# Patient Record
Sex: Female | Born: 1960 | Race: Black or African American | Hispanic: No | Marital: Single | Smoking: Never smoker
Health system: Southern US, Community
[De-identification: ages and names within clinical notes are randomized; demographics above are authoritative.]

## PROBLEM LIST (undated history)

## (undated) HISTORY — PX: OTHER SURGICAL HISTORY: SHX169

## (undated) HISTORY — PX: FOOT OSTEOTOMY W/ PLANTAR FASCIA RELEASE: SHX1665

## (undated) HISTORY — PX: ABDOMINAL HYSTERECTOMY: SHX81

---

## 1999-04-05 ENCOUNTER — Encounter: Payer: Self-pay | Admitting: Emergency Medicine

## 1999-04-05 ENCOUNTER — Emergency Department (HOSPITAL_COMMUNITY): Admission: EM | Admit: 1999-04-05 | Discharge: 1999-04-05 | Payer: Self-pay | Admitting: Emergency Medicine

## 2001-03-22 ENCOUNTER — Encounter: Payer: Self-pay | Admitting: Internal Medicine

## 2001-03-22 ENCOUNTER — Emergency Department (HOSPITAL_COMMUNITY): Admission: EM | Admit: 2001-03-22 | Discharge: 2001-03-22 | Payer: Self-pay | Admitting: Internal Medicine

## 2005-04-07 ENCOUNTER — Encounter: Admission: RE | Admit: 2005-04-07 | Discharge: 2005-04-07 | Payer: Self-pay | Admitting: Orthopedic Surgery

## 2005-10-22 ENCOUNTER — Ambulatory Visit (HOSPITAL_COMMUNITY): Admission: RE | Admit: 2005-10-22 | Discharge: 2005-10-22 | Payer: Self-pay | Admitting: Orthopedic Surgery

## 2006-03-08 ENCOUNTER — Ambulatory Visit (HOSPITAL_BASED_OUTPATIENT_CLINIC_OR_DEPARTMENT_OTHER): Admission: RE | Admit: 2006-03-08 | Discharge: 2006-03-08 | Payer: Self-pay | Admitting: Orthopedic Surgery

## 2008-12-31 ENCOUNTER — Encounter: Admission: RE | Admit: 2008-12-31 | Discharge: 2008-12-31 | Payer: Self-pay | Admitting: Family Medicine

## 2010-09-21 ENCOUNTER — Encounter: Payer: Self-pay | Admitting: Family Medicine

## 2011-01-15 NOTE — Op Note (Signed)
Lisa Hanson, Lisa Hanson               ACCOUNT NO.:  [id number]   MEDICAL RECORD NO.:  [id number]          PATIENT TYPE:  AMB   LOCATION:  DSC                          FACILITY:  [facility]   PHYSICIAN:  Leonides Grills, M.D.     DATE OF BIRTH:  05/06/1961   DATE OF PROCEDURE:  03/08/2006  DATE OF DISCHARGE:                                 OPERATIVE REPORT   PREOPERATIVE DIAGNOSES:  1.  Left anterolateral ankle impingement.  2.  Subluxing left peroneal tendons.  3.  Left peroneal tenosynovitis.   POSTOPERATIVE DIAGNOSES:  1.  Left anterolateral ankle impingement.  2.  Subluxing left peroneal tendons.  3.  Left peroneal tenosynovitis.   OPERATION:  1.  Left ankle arthroscopy with extensive debridement.  2.  Left repair of subluxing peroneal tendons without fibular osteotomy.  3.  Left tenosynovectomy, peroneal tendons.   ANESTHESIA:  General.   SURGEON:  Leonides Grills, M.D.; Lianne Cure, P.A.   ESTIMATED BLOOD LOSS:  Minimal.   TOURNIQUET TIME:  Approximately an hour.   COMPLICATIONS:  None.   DISPOSITION:  Stable to PR.   INDICATIONS:  This is a 50 year old female who has had longstanding lateral  left ankle pain that was interfering with her life when she had things she  wanted to do.  She was consented for the above procedure and all risks,  which included infection, neurovascular injury, persistent pain, worse pain,  stiffness, arthritis, weakness, instability, were all explained.  Questions  were encouraged and answered.   OPERATIVE PROCEDURE:  The patient was brought to the operating room, placed  in supine position initially.  After adequate general endotracheal tube  anesthesia was administered as well as Ancef 1 g IV piggyback, the patient  was then placed in a sloppy lateral position, operative site up on a  beanbag.  All bony prominences were well-padded.  Left lower extremity was  then prepped and draped in a sterile manner with proximally placed thigh  tourniquet.  We marked out the anatomical landmarks to include anterior  tibialis tendon, peroneus teres tertius.  Superficial peroneal nerve could  not be visualized.  Spinal needle was then placed just medial to the  anterior tibialis tendon, 20 mL of normal saline was instilled in the ankle.  Lisa Hanson and spread technique was then utilized to create the anteromedial  portal, medial to the anterior tibialis tendon.  Blunt-tipped trocar with  cannula followed by camera was placed into the ankle and under direct  visualization, the anterolateral portal was then created lateral to the  peroneus teres tertius tendon, illuminating the skin, to prevent any injury  to the superficial peroneal nerve.  This was done with a spinal needle  followed by nick and spread technique.  There was an accessory tib-fib  ligament with synovitis around the ligament itself.  There was subtle  impingement of the anterolateral aspect of the talus but there was no  osteochondral lesion.  We then performed an extensive debridement of the  ankle synovitis and excision of the accessory tib-fib ligament using a bevel  radiofrequency debrider.  Once this was  done, the ankle was ranged, there  was no impingement, there was adequate widening of the distal syndesmosis as  well, and the entire rest of the ankle was without pathology.  Pictures were  obtained throughout the procedure.  Camera was removed.   Then, the limb was gradually exsanguinated, tourniquet was elevated to 200  mmHg.  A curvilinear incision was made on the posterolateral aspect of the  ankle.  Dissection was carried down through the skin.  Hemostasis was  obtained.  Retinaculum was incised carefully, about 2 mm from the  posterolateral surface of the lateral malleolus.  Once this was done, care  was taken not to injure the peroneal tendons.  There was a tremendous amount  of synovitis within the peroneal tendons.  In the process of performing a   tenosynovectomy, it was found that the peroneus brevis tendon muscle belly  was distalized even beyond the tip of the lateral malleolus, or just to the  tip of the lateral malleolus.  We then debrided the muscle belly back  approximately 3 cm, and found that this was adequate for decompression of  this area.  There was no tear within the peroneal tendons.  There was  adequate concavity of the posterior aspect of the lateral malleolus.  A  trough was then created on the posterolateral aspect of the lateral  malleolus with a curved 0.25-inch osteotome and a rongeur.  Once this was  created, 2 mm drill holes were placed, and a micro-bioabsorbable suture  anchor was placed with #2 FiberWire.  The area was copiously irrigated with  normal saline between the tendons in the area.  We then placed a #2  FiberWire, not only with the anchors, but also with free FiberWire,  visualizing the tendons to make sure that they were not violated.  A  retinaculum was then advanced and repaired back to the exposed trough.  Once  this was done, the ankle was ranged and there was excellent excursion of the  tendons, and the repair was excellent as well.  The area was copiously  irrigated with normal saline.  Once again, tourniquet was deflated,  hemostasis was obtained, subcutaneous was closed with 3-0 Vicryl, skin was  closed with 4-0 nylon, overall wounds.  Sterile dressing was applied,  modified Jones dressing was applied.  The patient was stable to the PAR.      Leonides Grills, M.D.  Electronically Signed     PB/MEDQ  D:  03/08/2006  T:  03/09/2006  Job:  [id number]

## 2011-03-10 ENCOUNTER — Encounter: Payer: 59 | Attending: Endocrinology

## 2011-03-10 DIAGNOSIS — Z713 Dietary counseling and surveillance: Secondary | ICD-10-CM | POA: Insufficient documentation

## 2011-03-10 DIAGNOSIS — E119 Type 2 diabetes mellitus without complications: Secondary | ICD-10-CM | POA: Insufficient documentation

## 2011-03-10 NOTE — Progress Notes (Signed)
  Patient was seen on 03/10/11  for the first of a series of three diabetes self-management courses at the [facility]. The following learning objectives were met by the patient during this course:   Defines diabetes and the role of insulin  Identifies type of diabetes and pathophysiology  States normal BG range and personal goals  Identifies three risk factors for the development of diabetes  States the need for and frequency of healthcare follow up (ADA Standards of Care)   Patient has established the following initial goals:  Increase exercise levels  Work on managing stress levels  Follow a diabetes meal plan  Lose weight  Monitor blood glucose levels more frequently  Follow-Up Plan: Patient will attend Core Diabetes Classes at [facility] in August 2012

## 2011-03-10 NOTE — Patient Instructions (Signed)
Patient will attend Core Diabetes Classes at [facility] in August 2012

## 2011-04-15 ENCOUNTER — Ambulatory Visit: Payer: 59

## 2011-04-22 ENCOUNTER — Ambulatory Visit: Payer: 59

## 2011-04-26 ENCOUNTER — Encounter: Payer: Self-pay | Admitting: Dietician

## 2011-09-21 ENCOUNTER — Ambulatory Visit (INDEPENDENT_AMBULATORY_CARE_PROVIDER_SITE_OTHER): Payer: 59

## 2011-09-21 DIAGNOSIS — E860 Dehydration: Secondary | ICD-10-CM

## 2011-09-21 DIAGNOSIS — R112 Nausea with vomiting, unspecified: Secondary | ICD-10-CM

## 2012-06-21 ENCOUNTER — Ambulatory Visit (INDEPENDENT_AMBULATORY_CARE_PROVIDER_SITE_OTHER): Payer: 59 | Admitting: Family Medicine

## 2012-06-21 ENCOUNTER — Ambulatory Visit: Payer: 59

## 2012-06-21 VITALS — BP 120/78 | HR 78 | Temp 98.1°F | Resp 16 | Ht 67.0 in | Wt 202.2 lb

## 2012-06-21 DIAGNOSIS — M79671 Pain in right foot: Secondary | ICD-10-CM

## 2012-06-21 DIAGNOSIS — M25571 Pain in right ankle and joints of right foot: Secondary | ICD-10-CM

## 2012-06-21 DIAGNOSIS — M79609 Pain in unspecified limb: Secondary | ICD-10-CM

## 2012-06-21 DIAGNOSIS — M25579 Pain in unspecified ankle and joints of unspecified foot: Secondary | ICD-10-CM

## 2012-06-21 DIAGNOSIS — E119 Type 2 diabetes mellitus without complications: Secondary | ICD-10-CM

## 2012-06-21 NOTE — Progress Notes (Deleted)
  Subjective:    Patient ID: Lisa Hanson, female    DOB: 04-Jan-1961, 51 y.o.   MRN: [id number]  HPI    Review of Systems     Objective:   Physical Exam        Assessment & Plan:

## 2012-06-21 NOTE — Progress Notes (Signed)
New Patient Visit:  HPI:  Ankle and foot pain x 2 days.  Pt was cutting grass and rolled ankle in hole.  Has had ankle pain and swelling since this point.  No numbness or paresthesias.  Baseline diabetic.  Last A1C was 9 approx 4 months ago.  No prior hx/o peripheral neuropathy.   Patient Active Problem List  Diagnosis  . Diabetes mellitus   Past Medical History: Past Medical History  Diagnosis Date  . Diabetes mellitus     Past Surgical History: Past Surgical History  Procedure Date  . Abdominal hysterectomy     Social History: History   Social History  . Marital Status: Single    Spouse Name: N/A    Number of Children: N/A  . Years of Education: N/A   Social History Main Topics  . Smoking status: Never Smoker   . Smokeless tobacco: Not on file  . Alcohol Use: No  . Drug Use:   . Sexually Active:    Other Topics Concern  . Not on file   Social History Narrative  . No narrative on file    Family History: Family History  Problem Relation Age of Onset  . Diabetes Mother   . Diabetes Father     Allergies: No Known Allergies  Current Outpatient Prescriptions  Medication Sig Dispense Refill  . insulin glargine (LANTUS) 100 UNIT/ML injection 60 Units 2 (two) times daily.      Marland Kitchen lisinopril (PRINIVIL,ZESTRIL) 10 MG tablet Take 10 mg by mouth daily.      . metFORMIN (GLUCOPHAGE) 500 MG tablet Take 500 mg by mouth 2 (two) times daily with a meal.      . rosuvastatin (CRESTOR) 10 MG tablet Take 10 mg by mouth daily.       Review Of Systems: 12 point ROS negative except as noted above in HPI.   Physical Exam: Filed Vitals:   06/21/12 0915  BP: 120/78  Pulse: 78  Temp: 98.1 F (36.7 C)  Resp: 16   General: alert and cooperative HEENT: PERRLA and extra ocular movement intact Heart: S1, S2 normal, no murmur, rub or gallop, regular rate and rhythm Lungs: unlabored breathing Abdomen: abdomen is soft without significant tenderness, masses, organomegaly  or guarding Extremities: + R lateral ankle TTP, + pain with ankle eversion and inversion. Mild lateral ankle swelling. No distal numbness.  Skin:no rashes Neurology: normal without focal findings  Labs and Imaging:  [facility] reading (PRIMARY) by  Dr. Alvester Morin. R ankle and R foot xrays preliminarily negative for fracture or dislocation.    Assessment and Plan: R ankle/foot sprain.  Will place in ankle brace.  RICE and NSAIDs.  Discussed general care and red flags for reevaluation.  Will plan for follow up with sports medicine in setting of poorly controlled DM given risk for secondary complications.     The patient and/or caregiver has been counseled thoroughly with regard to treatment plan and/or medications prescribed including dosage, schedule, interactions, rationale for use, and possible side effects and they verbalize understanding. Diagnoses and expected course of recovery discussed and will return if not improved as expected or if the condition worsens. Patient and/or caregiver verbalized understanding.

## 2012-06-28 ENCOUNTER — Ambulatory Visit (INDEPENDENT_AMBULATORY_CARE_PROVIDER_SITE_OTHER): Payer: 59 | Admitting: Family Medicine

## 2012-06-28 VITALS — BP 126/78 | Ht 67.0 in | Wt 203.0 lb

## 2012-06-28 DIAGNOSIS — S86319A Strain of muscle(s) and tendon(s) of peroneal muscle group at lower leg level, unspecified leg, initial encounter: Secondary | ICD-10-CM

## 2012-06-28 DIAGNOSIS — S86819A Strain of other muscle(s) and tendon(s) at lower leg level, unspecified leg, initial encounter: Secondary | ICD-10-CM

## 2012-06-28 DIAGNOSIS — S838X9A Sprain of other specified parts of unspecified knee, initial encounter: Secondary | ICD-10-CM

## 2012-06-28 NOTE — Assessment & Plan Note (Addendum)
Will provide a sports aircast for support.  Continue icing and alternating ibuprofen and tylenol for pain.  ROM exercises given (alphabet with big toe).  Will return to clinic in 2-3 weeks if no improvement.  Will likely need cam walker if not improving. Topical NSAIDs and Oral NSAIDs prn

## 2012-06-28 NOTE — Progress Notes (Signed)
  Subjective:    Patient ID: ARIE GABLE, female    DOB: 1961-08-18, 51 y.o.   MRN: [id number]  HPI Ms. Kildow is here today for evaluation of right foot pain.  She reports rolling her ankle while doing yard work 10 days ago.  There was some mild swelling the first two days.  Pain is located more in the lateral foot than ankle.  She was able to immediately bear weight, but with pain.  Two days after the injury she was evaluated at urgent care where then took x-rays which were negative for fractures.  She is wearing an ankle brace, but continues to have pain, particularly after being on her feet at work.  Icing daily.  Taking ibuprofen and tylenol for pain.  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

## 2012-06-28 NOTE — Patient Instructions (Addendum)
You strained the peroneal tendons. Keep icing it everyday with the frozen water bottle. Continue to use tylenol or ibuprofen as needed. Wear the brace every day.  Spell the alphabet with your foot at least once a day. Follow up in 2-3 weeks in your foot is not better.

## 2012-10-03 ENCOUNTER — Ambulatory Visit (INDEPENDENT_AMBULATORY_CARE_PROVIDER_SITE_OTHER): Payer: 59 | Admitting: Family Medicine

## 2012-10-03 VITALS — BP 114/73 | HR 99 | Temp 98.5°F | Resp 18 | Ht 65.78 in | Wt 196.0 lb

## 2012-10-03 DIAGNOSIS — J329 Chronic sinusitis, unspecified: Secondary | ICD-10-CM

## 2012-10-03 MED ORDER — IPRATROPIUM BROMIDE 0.03 % NA SOLN: 2.0000 | Freq: Two times a day (BID) | NASAL | Status: DC

## 2012-10-03 MED ORDER — FLUTICASONE PROPIONATE 50 MCG/ACT NA SUSP: 2.0000 | Freq: Every day | NASAL | Status: DC

## 2012-10-03 MED ORDER — HYDROCODONE-HOMATROPINE 5-1.5 MG/5ML PO SYRP: 5.0000 mL | ORAL_SOLUTION | Freq: Three times a day (TID) | ORAL | Status: DC | PRN

## 2012-10-03 MED ORDER — AMOXICILLIN 875 MG PO TABS: 875.0000 mg | ORAL_TABLET | Freq: Three times a day (TID) | ORAL | Status: DC

## 2012-10-03 NOTE — Patient Instructions (Addendum)
Hot showers or breathing in steam may help loosen the congestion.  Using a netti pot or sinus rinse is also likely to help you feel better and keep this from progressing.  Use the atrovent nasal spray as needed throughout the day and use the fluticasone nasal spray every night before bed for at least 2 weeks.  I recommend augmenting with generic mucinex to help you move out the congestion.  If no improvement or you are getting worse, come back as you might need a course of steroids but hopefully with all of the above, you can avoid it.  If you get worse with more fevers and chills, then start the amoxicillin  Use some ear wax softening drops for several days (like debrox) then come back to clinic to get your ears flushed out.     Sinusitis Sinusitis is redness, soreness, and swelling (inflammation) of the paranasal sinuses. Paranasal sinuses are air pockets within the bones of your face (beneath the eyes, the middle of the forehead, or above the eyes). In healthy paranasal sinuses, mucus is able to drain out, and air is able to circulate through them by way of your nose. However, when your paranasal sinuses are inflamed, mucus and air can become trapped. This can allow bacteria and other germs to grow and cause infection. Sinusitis can develop quickly and last only a short time (acute) or continue over a long period (chronic). Sinusitis that lasts for more than 12 weeks is considered chronic.  CAUSES  Causes of sinusitis include:  Allergies.  Structural abnormalities, such as displacement of the cartilage that separates your nostrils (deviated septum), which can decrease the air flow through your nose and sinuses and affect sinus drainage.  Functional abnormalities, such as when the small hairs (cilia) that line your sinuses and help remove mucus do not work properly or are not present. SYMPTOMS  Symptoms of acute and chronic sinusitis are the same. The primary symptoms are pain and pressure around  the affected sinuses. Other symptoms include:  Upper toothache.  Earache.  Headache.  Bad breath.  Decreased sense of smell and taste.  A cough, which worsens when you are lying flat.  Fatigue.  Fever.  Thick drainage from your nose, which often is green and may contain pus (purulent).  Swelling and warmth over the affected sinuses. DIAGNOSIS  Your caregiver will perform a physical exam. During the exam, your caregiver may:  Look in your nose for signs of abnormal growths in your nostrils (nasal polyps).  Tap over the affected sinus to check for signs of infection.  View the inside of your sinuses (endoscopy) with a special imaging device with a light attached (endoscope), which is inserted into your sinuses. If your caregiver suspects that you have chronic sinusitis, one or more of the following tests may be recommended:  Allergy tests.  Nasal culture A sample of mucus is taken from your nose and sent to a lab and screened for bacteria.  Nasal cytology A sample of mucus is taken from your nose and examined by your caregiver to determine if your sinusitis is related to an allergy. TREATMENT  Most cases of acute sinusitis are related to a viral infection and will resolve on their own within 10 days. Sometimes medicines are prescribed to help relieve symptoms (pain medicine, decongestants, nasal steroid sprays, or saline sprays).  However, for sinusitis related to a bacterial infection, your caregiver will prescribe antibiotic medicines. These are medicines that will help kill the bacteria causing  the infection.  Rarely, sinusitis is caused by a fungal infection. In theses cases, your caregiver will prescribe antifungal medicine. For some cases of chronic sinusitis, surgery is needed. Generally, these are cases in which sinusitis recurs more than 3 times per year, despite other treatments. HOME CARE INSTRUCTIONS   Drink plenty of water. Water helps thin the mucus so your  sinuses can drain more easily.  Use a humidifier.  Inhale steam 3 to 4 times a day (for example, sit in the bathroom with the shower running).  Apply a warm, moist washcloth to your face 3 to 4 times a day, or as directed by your caregiver.  Use saline nasal sprays to help moisten and clean your sinuses.  Take over-the-counter or prescription medicines for pain, discomfort, or fever only as directed by your caregiver. SEEK IMMEDIATE MEDICAL CARE IF:  You have increasing pain or severe headaches.  You have nausea, vomiting, or drowsiness.  You have swelling around your face.  You have vision problems.  You have a stiff neck.  You have difficulty breathing. MAKE SURE YOU:   Understand these instructions.  Will watch your condition.  Will get help right away if you are not doing well or get worse. Document Released: 08/16/2005 Document Revised: 11/08/2011 Document Reviewed: 08/31/2011 [facility] Patient Information 2013 [place], [place].

## 2012-10-03 NOTE — Progress Notes (Signed)
Subjective:    Patient ID: Lisa Hanson, female    DOB: 10-20-1960, 52 y.o.   MRN: [id number]  HPI  At first she thought she had a sinus infection which started last Fri - 4d prev - with sneezing a lot then woke up with right face hurting the following day, no energy, then went to work Sun night at 3rd shift and began feeling horrible.  Yesterday she began coughing spells productive of phlegm - coughing so much she regurgitates.  She is having chills but no fevers.  No myalgias/arthralgias though is having some right flank pain that began yesterday.  Tol po well, and urinating normally, nml BM.  Sleeping a lot.  Using sinus pressure otc med w/ minimal relief.  No known sick contacts.  Did not get flu shot this year.  She is watching her cbgs and they have increased to high 200s - she is still taking her insulin 70 u qam, 25 with lunch, 40 with dinner.    Past Medical History  Diagnosis Date  . Diabetes mellitus     Review of Systems  Constitutional: Positive for chills, activity change and fatigue. Negative for fever, diaphoresis and appetite change.  HENT: Positive for ear pain, congestion, sore throat, facial swelling, rhinorrhea, sneezing and sinus pressure. Negative for nosebleeds, trouble swallowing, neck pain, neck stiffness, voice change, postnasal drip and ear discharge.   Eyes: Negative for discharge and itching.  Respiratory: Positive for cough. Negative for shortness of breath.   Cardiovascular: Positive for chest pain.  Gastrointestinal: Positive for vomiting. Negative for nausea, abdominal pain, diarrhea and constipation.  Genitourinary: Positive for flank pain. Negative for dysuria, urgency, frequency, hematuria, decreased urine volume and difficulty urinating.  Musculoskeletal: Negative for myalgias, arthralgias and gait problem.  Skin: Negative for rash.  Neurological: Positive for headaches. Negative for dizziness and syncope.  Hematological: Positive for adenopathy.   Psychiatric/Behavioral: Negative for sleep disturbance.      BP 114/73  Pulse 99  Temp 98.5 F (36.9 C) (Oral)  Resp 18  Ht 5' 5.78" (1.671 m)  Wt 196 lb (88.905 kg)  BMI 31.85 kg/m2  SpO2 98% Objective:   Physical Exam  Constitutional: She is oriented to person, place, and time. She appears well-developed and well-nourished. She appears lethargic. She appears ill. No distress.  HENT:  Head: Normocephalic and atraumatic.  Right Ear: External ear normal.  Left Ear: External ear normal.  Nose: Mucosal edema and rhinorrhea present. Right sinus exhibits maxillary sinus tenderness. Left sinus exhibits maxillary sinus tenderness.  Mouth/Throat: Uvula is midline and mucous membranes are normal. Posterior oropharyngeal erythema present. No oropharyngeal exudate, posterior oropharyngeal edema or tonsillar abscesses.       Bilateral canals impacted w/ dark black cerumen with what looks like brown cotton qtip threads embedded  Eyes: Conjunctivae normal are normal. Right eye exhibits no discharge. Left eye exhibits no discharge. No scleral icterus.  Neck: Normal range of motion. Neck supple.  Cardiovascular: Normal rate, regular rhythm, normal heart sounds and intact distal pulses.   Pulmonary/Chest: Effort normal and breath sounds normal.  Lymphadenopathy:       Head (right side): Submandibular adenopathy present. No preauricular and no posterior auricular adenopathy present.       Head (left side): Submandibular adenopathy present. No preauricular and no posterior auricular adenopathy present.    She has no cervical adenopathy.       Right: No supraclavicular adenopathy present.       Left: No supraclavicular adenopathy  present.  Neurological: She is oriented to person, place, and time. She appears lethargic.  Skin: Skin is warm and dry. She is not diaphoretic. No erythema.  Psychiatric: She has a normal mood and affect. Her behavior is normal.          Assessment & Plan:   1.  Sinusitis  amoxicillin (AMOXIL) 875 MG tablet, ipratropium (ATROVENT) 0.03 % nasal spray, fluticasone (FLONASE) 50 MCG/ACT nasal spray, HYDROcodone-homatropine (HYCODAN) 5-1.5 MG/5ML syrup  2. DM - pt knows to keep taking her insulin even if she is eating less and to call her PCP or RTC here if cbgs cont to increase 3. Cerumen impaction - rec to use debrox ear drops for sev days then RTC for ear lavage Meds ordered this encounter  Medications         . amoxicillin (AMOXIL) 875 MG tablet    Sig: Take 1 tablet (875 mg total) by mouth 3 (three) times daily.    Dispense:  30 tablet    Refill:  0  . ipratropium (ATROVENT) 0.03 % nasal spray    Sig: Place 2 sprays into the nose 2 (two) times daily.    Dispense:  30 mL    Refill:  1  . fluticasone (FLONASE) 50 MCG/ACT nasal spray    Sig: Place 2 sprays into the nose daily.    Dispense:  16 g    Refill:  2  . HYDROcodone-homatropine (HYCODAN) 5-1.5 MG/5ML syrup    Sig: Take 5 mLs by mouth every 8 (eight) hours as needed for cough.    Dispense:  150 mL    Refill:  0

## 2013-04-15 ENCOUNTER — Ambulatory Visit (INDEPENDENT_AMBULATORY_CARE_PROVIDER_SITE_OTHER): Payer: 59 | Admitting: Emergency Medicine

## 2013-04-15 VITALS — BP 122/68 | HR 88 | Temp 98.8°F | Resp 16 | Ht 67.5 in | Wt 201.0 lb

## 2013-04-15 DIAGNOSIS — M5412 Radiculopathy, cervical region: Secondary | ICD-10-CM

## 2013-04-15 DIAGNOSIS — G5621 Lesion of ulnar nerve, right upper limb: Secondary | ICD-10-CM

## 2013-04-15 DIAGNOSIS — M7710 Lateral epicondylitis, unspecified elbow: Secondary | ICD-10-CM

## 2013-04-15 DIAGNOSIS — M7711 Lateral epicondylitis, right elbow: Secondary | ICD-10-CM

## 2013-04-15 MED ORDER — TENNIS ELBOW STRAP MISC: 1.0000 | Freq: Every day | Status: DC

## 2013-04-15 MED ORDER — NAPROXEN SODIUM 550 MG PO TABS: 550.0000 mg | ORAL_TABLET | Freq: Two times a day (BID) | ORAL | Status: AC

## 2013-04-15 NOTE — Progress Notes (Signed)
[facility] [address] [place] [address] [phone]  Date:  04/15/2013   Name:  Lisa Hanson   DOB:  Aug 30, 1961   MRN:  [id number]  PCP:  No primary provider on file.    Chief Complaint: Elbow Pain   History of Present Illness:  Lisa Hanson is a 52 y.o. very pleasant female patient who presents with the following:  History of pain in right elbow and numbness in her ulnar distribution especially her fifth right finger.  No history of injury.  Has had pain for months.  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 She uses her right arm extensively at work.   Has been off season since April.  No improvement with over the counter medications or other home remedies. Denies other complaint or health concern today.   Patient Active Problem List   Diagnosis Date Noted  . Strain of peroneal tendon 06/28/2012  . Diabetes mellitus 03/10/2011    Past Medical History  Diagnosis Date  . Diabetes mellitus     Past Surgical History  Procedure Laterality Date  . Abdominal hysterectomy    . Ankle      reconstructive surgery    History  Substance Use Topics  . Smoking status: Never Smoker   . Smokeless tobacco: Not on file  . Alcohol Use: No    Family History  Problem Relation Age of Onset  . Diabetes Mother   . Heart failure Mother   . Diabetes Father     No Known Allergies  Medication list has been reviewed and updated.  Current Outpatient Prescriptions on File Prior to Visit  Medication Sig Dispense Refill  . Insulin Aspart Prot & Aspart (NOVOLOG MIX 70/30 Hatch) Inject into the skin. Pt dosage 70-25-40      . lisinopril (PRINIVIL,ZESTRIL) 10 MG tablet Take 10 mg by mouth daily.      . metFORMIN (GLUCOPHAGE) 500 MG tablet Take 500 mg by mouth 2 (two) times daily with a meal.      . rosuvastatin (CRESTOR) 10 MG tablet Take 10 mg by mouth daily.      Marland Kitchen amoxicillin (AMOXIL) 875 MG tablet Take 1 tablet (875 mg total) by mouth 3  (three) times daily.  30 tablet  0  . fluticasone (FLONASE) 50 MCG/ACT nasal spray Place 2 sprays into the nose daily.  16 g  2  . HYDROcodone-homatropine (HYCODAN) 5-1.5 MG/5ML syrup Take 5 mLs by mouth every 8 (eight) hours as needed for cough.  150 mL  0  . ipratropium (ATROVENT) 0.03 % nasal spray Place 2 sprays into the nose 2 (two) times daily.  30 mL  1   No current facility-administered medications on file prior to visit.    Review of Systems:  As per HPI, otherwise negative.    Physical Examination: Filed Vitals:   04/15/13 1252  BP: 122/68  Pulse: 88  Temp: 98.8 F (37.1 C)  Resp: 16   Filed Vitals:   04/15/13 1252  Height: 5' 7.5" (1.715 m)  Weight: 201 lb (91.173 kg)   Body mass index is 31 kg/(m^2). Ideal Body Weight: Weight in (lb) to have BMI = 25: 161.7   GEN: WDWN, NAD, Non-toxic, Alert & Oriented x 3 HEENT: Atraumatic, Normocephalic.  Ears and Nose: No external deformity. EXTR: No clubbing/cyanosis/edema NEURO: Normal gait.  PSYCH: Normally interactive. Conversant. Not depressed or anxious appearing.  Calm demeanor.  RIGHT elbow:  Tender lateral condyle.  Neuro grossly intact with normal motor and sensory.  Assessment and Plan: Tennis elbow Ulnar neuritis Anaprox Tennis elbow strap EMG/NCS Follow up as needed   Signed,  Phillips Odor, MD

## 2013-04-15 NOTE — Patient Instructions (Addendum)
Tennis Elbow  Your caregiver has diagnosed you with a condition often referred to as "tennis elbow." This results from small tears or soreness (inflammation) at the start (origin) of the extensor muscles of the forearm. Although the condition is often called tennis or golfer's elbow, it is caused by any repetitive action performed by your elbow.  HOME CARE INSTRUCTIONS   If the condition has been short lived, rest may be the only treatment required. Using your opposite hand or arm to perform the task may help. Even changing your grip may help rest the extremity. These may even prevent the condition from recurring.   Longer standing problems, however, will often be relieved faster by:   Using anti-inflammatory agents.   Applying ice packs for 30 minutes at the end of the working day, at bed time, or when activities are finished.   Your caregiver may also have you wear a splint or sling. This will allow the inflamed tendon to heal.  At times, steroid injections aided with a local anesthetic will be required along with splinting for 1 to 2 weeks. Two to three steroid injections will often solve the problem. In some long standing cases, the inflamed tendon does not respond to conservative (non-surgical) therapy. Then surgery may be required to repair it.  MAKE SURE YOU:    Understand these instructions.   Will watch your condition.   Will get help right away if you are not doing well or get worse.  Document Released: 08/16/2005 Document Revised: 11/08/2011 Document Reviewed: 04/03/2008  [employer] Patient Information 2014 [employer].

## 2013-04-27 ENCOUNTER — Ambulatory Visit (INDEPENDENT_AMBULATORY_CARE_PROVIDER_SITE_OTHER): Payer: 59 | Admitting: Neurology

## 2013-04-27 ENCOUNTER — Ambulatory Visit (INDEPENDENT_AMBULATORY_CARE_PROVIDER_SITE_OTHER): Payer: 59

## 2013-04-27 DIAGNOSIS — M79609 Pain in unspecified limb: Secondary | ICD-10-CM

## 2013-04-27 DIAGNOSIS — R209 Unspecified disturbances of skin sensation: Secondary | ICD-10-CM

## 2013-04-27 DIAGNOSIS — G542 Cervical root disorders, not elsewhere classified: Secondary | ICD-10-CM

## 2013-04-27 NOTE — Procedures (Signed)
  HISTORY:  Lisa Hanson is a 52 year old patient with a several month history of right arm discomfort and some tingling sensations into the fifth finger of the right hand. The patient does have some right shoulder discomfort as well. The patient is being evaluated for a possible neuropathy or a cervical radiculopathy.  NERVE CONDUCTION STUDIES:  Nerve conduction studies were performed on both upper extremities. The distal motor latencies and motor amplitudes for the median and ulnar nerves were within normal limits. The F wave latencies and nerve conduction velocities for these nerves were also normal. The sensory latencies for the median and ulnar nerves were normal.   EMG STUDIES:  EMG study was performed on the right upper extremity:  The first dorsal interosseous muscle reveals 2 to 4 K units with full recruitment. No fibrillations or positive waves were noted. The abductor pollicis brevis muscle reveals 2 to 5 K units with decreased recruitment. No fibrillations or positive waves were noted. The extensor indicis proprius muscle reveals 1 to 3 K units with full recruitment. No fibrillations or positive waves were noted. The pronator teres muscle reveals 2 to 3 K units with full recruitment. No fibrillations or positive waves were noted. The biceps muscle reveals 1 to 2 K units with full recruitment. No fibrillations or positive waves were noted. The triceps muscle reveals 2 to 4 K units with full recruitment. One plus fibrillations and positive waves were noted. The anterior deltoid muscle reveals 2 to 3 K units with full recruitment. No fibrillations or positive waves were noted. Insertional activity was slightly increased. The cervical paraspinal muscles were tested at 2 levels. 2+ fibrillations and positive waves were seen at both levels tested. There was good relaxation.   IMPRESSION:  Nerve conduction studies done on both upper extremities were within normal limits. No evidence of  a neuropathy is seen. EMG evaluation of the right upper extremity shows abnormalities that are most consistent with an acute right C7 radiculopathy, possibly with some C6 involvement as well.  Marlan Palau MD 04/27/2013 4:07 PM  [facility] [address] [place], [place] [address]  Phone [phone] Fax [phone]

## 2013-05-01 NOTE — Addendum Note (Signed)
Addended by: Carmelina Dane on: 05/01/2013 08:58 AM   Modules accepted: Orders

## 2013-05-08 ENCOUNTER — Ambulatory Visit (INDEPENDENT_AMBULATORY_CARE_PROVIDER_SITE_OTHER): Payer: 59 | Admitting: Physician Assistant

## 2013-05-08 ENCOUNTER — Ambulatory Visit
Admission: RE | Admit: 2013-05-08 | Discharge: 2013-05-08 | Disposition: A | Discharge status: Home / Self Care | Payer: 59 | Source: Ambulatory Visit | Attending: Emergency Medicine | Admitting: Emergency Medicine

## 2013-05-08 VITALS — BP 110/64 | HR 81 | Temp 98.2°F | Resp 18 | Ht 67.0 in | Wt 204.0 lb

## 2013-05-08 DIAGNOSIS — H6120 Impacted cerumen, unspecified ear: Secondary | ICD-10-CM

## 2013-05-08 DIAGNOSIS — H6123 Impacted cerumen, bilateral: Secondary | ICD-10-CM

## 2013-05-08 DIAGNOSIS — G5621 Lesion of ulnar nerve, right upper limb: Secondary | ICD-10-CM

## 2013-05-08 NOTE — Progress Notes (Signed)
  Subjective:    Patient ID: Lisa Hanson, female    DOB: 08/08/1961, 52 y.o.   MRN: [id number]  HPI   Lisa Hanson is a very pleasant 52 yr old female here with concern for cerumen impaction.  Was having a hearing test done yesterday at work and nurse told her there was lots of wax in the right ear.  Nurse suggested an otc ear drop which pt used, now states can't hear out of right ear.  Feels like she's in a tunnel.  No pain or pressure.  No drainage.  Left ear feels ok.  No associated symptoms.  Pt does wear ear plugs at work, wonders if this could have caused wax to become impacted.     Review of Systems  Constitutional: Negative.   HENT: Positive for hearing loss (right). Negative for ear pain, congestion, rhinorrhea and tinnitus.   Respiratory: Negative.   Cardiovascular: Negative.   Gastrointestinal: Negative.   Musculoskeletal: Negative.   Skin: Negative.   Neurological: Negative.        Objective:   Physical Exam  Vitals reviewed. Constitutional: She is oriented to person, place, and time. She appears well-developed and well-nourished. No distress.  HENT:  Head: Normocephalic and atraumatic.  Bilateral canals completely occluded with cerumen  Eyes: Conjunctivae are normal. No scleral icterus.  Pulmonary/Chest: Effort normal.  Lymphadenopathy:    She has no cervical adenopathy.  Neurological: She is alert and oriented to person, place, and time.  Skin: Skin is warm and dry.  Psychiatric: She has a normal mood and affect. Her behavior is normal.    Bilateral ears successfully irrigated with resolution of symptoms.  Canals patent and normal appearing, TMs normal intact and normal      Assessment & Plan:  Cerumen impaction, bilateral   Lisa Hanson is a very pleasant 52 yr old female with bilateral cerumen impaction.  Ears successfully irrigated with complete resolutions of symptoms.  Discussed prevention strategies.  Pt will see if she can use headphone ear protection  rather than ear plugs that go in her ears.  Pt to RTC if symptoms return.

## 2013-05-08 NOTE — Patient Instructions (Signed)
To help prevent cerumen accumulation, soak a cotton ball in mineral oil and place in the ears for about 10-20 minutes one time per week.  This helps soften the wax and allow your ears to clear it naturally.

## 2013-05-24 ENCOUNTER — Ambulatory Visit (INDEPENDENT_AMBULATORY_CARE_PROVIDER_SITE_OTHER): Payer: 59 | Admitting: Physician Assistant

## 2013-05-24 VITALS — BP 128/71 | HR 75 | Temp 98.1°F | Resp 16 | Ht 66.0 in | Wt 204.0 lb

## 2013-05-24 DIAGNOSIS — M5412 Radiculopathy, cervical region: Secondary | ICD-10-CM

## 2013-05-24 DIAGNOSIS — M25529 Pain in unspecified elbow: Secondary | ICD-10-CM

## 2013-05-24 DIAGNOSIS — G5621 Lesion of ulnar nerve, right upper limb: Secondary | ICD-10-CM

## 2013-05-24 DIAGNOSIS — M25521 Pain in right elbow: Secondary | ICD-10-CM

## 2013-05-24 MED ORDER — PREDNISONE 20 MG PO TABS: ORAL_TABLET | ORAL | Status: DC

## 2013-05-24 MED ORDER — TRAMADOL HCL 50 MG PO TABS: 50.0000 mg | ORAL_TABLET | Freq: Three times a day (TID) | ORAL | Status: DC | PRN

## 2013-05-24 NOTE — Progress Notes (Signed)
  Subjective:    Patient ID: Lisa Hanson, female    DOB: 09/04/1960, 52 y.o.   MRN: [id number]  HPI   Ms. Hunke is a very pleasant 52 yr old female here for follow up on right elbow pain.  Was initially seen here in Aug 2014, but has had pain since April 2014.  Pt reports pain at both sides of her elbow radiating up and down her arm.  Also has numbness in 4th and 5th fingers of right hand (previously only 5th finger was involved).  Pt tried counter force brace with no relief.  No relief with ibuprofen, aleve, anaprox.  She has stopped bowling as instructed.  She has not touched a bowling ball since last visit.  Nerve conduction study indicated possible C7 radiculopathy, but cervical MRI was negative.  Pain is 8.5-9/10 and constant.  Pt is exasperated and wants to know what the next step should be  No neck, shoulder, or back pain.  No weakness.  She is right hand dominant.  Review of Systems  Constitutional: Negative.   HENT: Negative.   Respiratory: Negative.   Cardiovascular: Negative.   Gastrointestinal: Negative.   Musculoskeletal: Positive for myalgias and arthralgias.  Skin: Negative.   Neurological: Positive for numbness. Negative for weakness.       Objective:   Physical Exam  Vitals reviewed. Constitutional: She is oriented to person, place, and time. She appears well-developed and well-nourished. No distress.  HENT:  Head: Normocephalic and atraumatic.  Eyes: Conjunctivae are normal. No scleral icterus.  Pulmonary/Chest: Effort normal.  Musculoskeletal:       Right shoulder: Normal.       Right elbow: She exhibits normal range of motion, no swelling, no effusion and no deformity. Tenderness found. Medial epicondyle, lateral epicondyle and olecranon process tenderness noted.       Right wrist: Normal.       Cervical back: Normal.       Right hand: She exhibits normal capillary refill. Decreased sensation noted. Decreased sensation is present in the ulnar distribution.  Normal strength noted.  Neurological: She is alert and oriented to person, place, and time. She has normal reflexes.  Skin: Skin is warm and dry.  Psychiatric: She has a normal mood and affect. Her behavior is normal.        Assessment & Plan:  Elbow pain, right - Plan: Ambulatory referral to Hand Surgery, predniSONE (DELTASONE) 20 MG tablet, traMADol (ULTRAM) 50 MG tablet  Ulnar neuritis, right - Plan: Ambulatory referral to Hand Surgery, predniSONE (DELTASONE) 20 MG tablet, traMADol (ULTRAM) 50 MG tablet   Ms. Monteleone is a very pleasant 52 yr old female with persistent elbow pain that has failed conservative treatment.  She now has worsening numbness in 4th and 5th fingers.  MRI is normal.  Will refer pt to ortho/hand surg for further evaluation and management.  In the mean time, will try a prednisone taper and tramadol for pain.  Continue activity modification.  If any symptoms worsening prior to ortho eval, pt to RTC.

## 2013-05-24 NOTE — Patient Instructions (Addendum)
I have referred you to our hand surgeons in town - you will receive a phone call about getting your evaluation scheduled  In the meantime, let's try a course of prednisone to see if we can get the inflammation calmed down.  DO NOT take any ibuprofen or aleve while doing the prednisone as this increases the risk of side effects.  Use the tramadol every 8 hours if needed for pain.  You may also use Tylenol.  If anything is worsening prior to your ortho appointment, please let us know.

## 2014-06-24 ENCOUNTER — Ambulatory Visit (INDEPENDENT_AMBULATORY_CARE_PROVIDER_SITE_OTHER): Payer: 59 | Admitting: Family Medicine

## 2014-06-24 VITALS — BP 124/72 | HR 76 | Temp 98.0°F | Resp 16 | Ht 67.0 in | Wt 195.2 lb

## 2014-06-24 DIAGNOSIS — Z1239 Encounter for other screening for malignant neoplasm of breast: Secondary | ICD-10-CM

## 2014-06-24 DIAGNOSIS — Z124 Encounter for screening for malignant neoplasm of cervix: Secondary | ICD-10-CM

## 2014-06-24 DIAGNOSIS — Z1211 Encounter for screening for malignant neoplasm of colon: Secondary | ICD-10-CM

## 2014-06-24 DIAGNOSIS — N898 Other specified noninflammatory disorders of vagina: Secondary | ICD-10-CM

## 2014-06-24 DIAGNOSIS — L298 Other pruritus: Secondary | ICD-10-CM

## 2014-06-24 LAB — POCT WET PREP WITH KOH
Clue Cells Wet Prep HPF POC: NEGATIVE
KOH PREP POC: NEGATIVE
RBC Wet Prep HPF POC: NEGATIVE
Trichomonas, UA: NEGATIVE
Yeast Wet Prep HPF POC: NEGATIVE

## 2014-06-24 MED ORDER — HYDROXYZINE HCL 10 MG PO TABS: 10.0000 mg | ORAL_TABLET | Freq: Three times a day (TID) | ORAL | Status: DC | PRN

## 2014-06-24 NOTE — Progress Notes (Signed)
   Subjective:    Patient ID: Lisa Hanson, female    DOB: 08-03-1961, 53 y.o.   MRN: [id number]  HPI Patient with 5 days of vaginal itching. Tried some OTC cream and thinks it has gotten worse. She has had yeast infections in the past, but never BV. She has been dating and has intercourse x3 recently after no sexual activity for 7 years. She had some pain and bleeding with the first encounter. She has used condoms each time and wonders if she is sensitive to the latex. She had problems with contact dermatitis on her hands when she had to wear latex gloves at work.  She does not have a PCP, but sees her endocrinologist (Dr. [name]) regularly. She has had difficulty managing her diabetes recently with her work schedule. She has an appointment with Dr. [name] in the next couple of weeks.    Review of Systems No fever, no chest pain, no SOB, no cough. No dysuria, no hematuria, no frequency.     Objective:   Physical Exam  Vitals reviewed. Constitutional: She is oriented to person, place, and time. She appears well-developed and well-nourished.  HENT:  Head: Normocephalic and atraumatic.  Eyes: Conjunctivae are normal.  Neck: Normal range of motion. Neck supple.  Cardiovascular: Normal rate, regular rhythm and normal heart sounds.   Pulmonary/Chest: Effort normal and breath sounds normal.  Genitourinary: Uterus normal. Pelvic exam was performed with patient supine. There is rash and tenderness on the right labia. There is rash and tenderness on the left labia. Cervix exhibits discharge and friability (small amount of bleeding with spatula specimen collection.). Cervix exhibits no motion tenderness. Right adnexum displays no mass, no tenderness and no fullness. Left adnexum displays no mass, no tenderness and no fullness. Vaginal discharge (thin, greenish discharge) found.  Musculoskeletal: Normal range of motion.  Neurological: She is alert and oriented to person, place, and time.  Skin: Skin is  warm and dry.  Psychiatric: She has a normal mood and affect. Her behavior is normal. Judgment and thought content normal.   Results for orders placed in visit on 06/24/14  POCT WET PREP WITH KOH      Result Value Ref Range   Trichomonas, UA Negative     Clue Cells Wet Prep HPF POC neg     Epithelial Wet Prep HPF POC 1-6     Yeast Wet Prep HPF POC neg     Bacteria Wet Prep HPF POC trace     RBC Wet Prep HPF POC neg     WBC Wet Prep HPF POC tntc     KOH Prep POC Negative        Assessment & Plan:  1. Vaginal itching - POCT Wet Prep with KOH - hydrOXYzine (ATARAX/VISTARIL) 10 MG tablet; Take 1 tablet (10 mg total) by mouth 3 (three) times daily as needed for itching.  Dispense: 30 tablet; Refill: 0 -Wet Prep negative for yeast/trich/BV, will treat irritation with Balmex externally, vistaril as above, patient to use non-latex condoms and lubricant to see if this helps.  2. Screening for cervical cancer - Pap IG, CT/NG w/ reflex HPV when ASC-U  3. Screening for breast cancer - MM Digital Screening; Future  4. Screening for colon cancer - Ambulatory referral to Gastroenterology    Elby Beck, FNP-BC  [facility]  06/24/2014 11:48 AM

## 2014-06-24 NOTE — Patient Instructions (Signed)
Balmex over the counter cream Vaginitis Vaginitis is an inflammation of the vagina. It is most often caused by a change in the normal balance of the bacteria and yeast that live in the vagina. This change in balance causes an overgrowth of certain bacteria or yeast, which causes the inflammation. There are different types of vaginitis, but the most common types are:  Bacterial vaginosis.  Yeast infection (candidiasis).  Trichomoniasis vaginitis. This is a sexually transmitted infection (STI).  Viral vaginitis.  Atropic vaginitis.  Allergic vaginitis. CAUSES  The cause depends on the type of vaginitis. Vaginitis can be caused by:  Bacteria (bacterial vaginosis).  Yeast (yeast infection).  A parasite (trichomoniasis vaginitis)  A virus (viral vaginitis).  Low hormone levels (atrophic vaginitis). Low hormone levels can occur during pregnancy, breastfeeding, or after menopause.  Irritants, such as bubble baths, scented tampons, and feminine sprays (allergic vaginitis). Other factors can change the normal balance of the yeast and bacteria that live in the vagina. These include:  Antibiotic medicines.  Poor hygiene.  Diaphragms, vaginal sponges, spermicides, birth control pills, and intrauterine devices (IUD).  Sexual intercourse.  Infection.  Uncontrolled diabetes.  A weakened immune system. SYMPTOMS  Symptoms can vary depending on the cause of the vaginitis. Common symptoms include:  Abnormal vaginal discharge.  The discharge is white, gray, or yellow with bacterial vaginosis.  The discharge is thick, white, and cheesy with a yeast infection.  The discharge is frothy and yellow or greenish with trichomoniasis.  A bad vaginal odor.  The odor is fishy with bacterial vaginosis.  Vaginal itching, pain, or swelling.  Painful intercourse.  Pain or burning when urinating. Sometimes, there are no symptoms. TREATMENT  Treatment will vary depending on the type of  infection.   Bacterial vaginosis and trichomoniasis are often treated with antibiotic creams or pills.  Yeast infections are often treated with antifungal medicines, such as vaginal creams or suppositories.  Viral vaginitis has no cure, but symptoms can be treated with medicines that relieve discomfort. Your sexual partner should be treated as well.  Atrophic vaginitis may be treated with an estrogen cream, pill, suppository, or vaginal ring. If vaginal dryness occurs, lubricants and moisturizing creams may help. You may be told to avoid scented soaps, sprays, or douches.  Allergic vaginitis treatment involves quitting the use of the product that is causing the problem. Vaginal creams can be used to treat the symptoms. HOME CARE INSTRUCTIONS   Take all medicines as directed by your caregiver.  Keep your genital area clean and dry. Avoid soap and only rinse the area with water.  Avoid douching. It can remove the healthy bacteria in the vagina.  Do not use tampons or have sexual intercourse until your vaginitis has been treated. Use sanitary pads while you have vaginitis.  Wipe from front to back. This avoids the spread of bacteria from the rectum to the vagina.  Let air reach your genital area.  Wear cotton underwear to decrease moisture buildup.  Avoid wearing underwear while you sleep until your vaginitis is gone.  Avoid tight pants and underwear or nylons without a cotton panel.  Take off wet clothing (especially bathing suits) as soon as possible.  Use mild, non-scented products. Avoid using irritants, such as:  Scented feminine sprays.  Fabric softeners.  Scented detergents.  Scented tampons.  Scented soaps or bubble baths.  Practice safe sex and use condoms. Condoms may prevent the spread of trichomoniasis and viral vaginitis. SEEK MEDICAL CARE IF:   You  have abdominal pain.  You have a fever or persistent symptoms for more than 2-3 days.  You have a fever and  your symptoms suddenly get worse. Document Released: 06/13/2007 Document Revised: 05/10/2012 Document Reviewed: 01/27/2012 [facility] Patient Information 2015 [place], [place]. This information is not intended to replace advice given to you by your health care provider. Make sure you discuss any questions you have with your health care provider.

## 2014-06-25 ENCOUNTER — Encounter: Payer: Self-pay | Admitting: Gastroenterology

## 2014-06-25 LAB — PAP IG, CT-NG, RFX HPV ASCU
Chlamydia Probe Amp: NEGATIVE
GC Probe Amp: NEGATIVE

## 2014-07-10 ENCOUNTER — Ambulatory Visit
Admission: RE | Admit: 2014-07-10 | Discharge: 2014-07-10 | Disposition: A | Discharge status: Home / Self Care | Payer: 59 | Source: Ambulatory Visit | Attending: Family Medicine | Admitting: Family Medicine

## 2014-07-10 DIAGNOSIS — Z1239 Encounter for other screening for malignant neoplasm of breast: Secondary | ICD-10-CM

## 2014-07-11 ENCOUNTER — Ambulatory Visit: Payer: 59

## 2014-08-12 ENCOUNTER — Ambulatory Visit (AMBULATORY_SURGERY_CENTER): Payer: Self-pay | Admitting: *Deleted

## 2014-08-12 VITALS — Ht 67.0 in | Wt 191.0 lb

## 2014-08-12 DIAGNOSIS — Z1211 Encounter for screening for malignant neoplasm of colon: Secondary | ICD-10-CM

## 2014-08-12 MED ORDER — MOVIPREP 100 G PO SOLR: ORAL | Status: DC

## 2014-08-12 NOTE — Progress Notes (Signed)
Patient denies any allergies to eggs or soy. Patient denies any problems with anesthesia/sedation. Patient denies any oxygen use at home and does not take any diet/weight loss medications. EMMI education assisgned to patient on colonoscopy, this was explained and instructions given to patient. 

## 2014-08-26 ENCOUNTER — Ambulatory Visit (AMBULATORY_SURGERY_CENTER): Payer: 59 | Admitting: Gastroenterology

## 2014-08-26 ENCOUNTER — Encounter: Payer: Self-pay | Admitting: Gastroenterology

## 2014-08-26 VITALS — BP 152/91 | HR 75 | Temp 98.9°F | Resp 8 | Ht 67.0 in | Wt 191.0 lb

## 2014-08-26 DIAGNOSIS — K929 Disease of digestive system, unspecified: Secondary | ICD-10-CM

## 2014-08-26 DIAGNOSIS — K389 Disease of appendix, unspecified: Secondary | ICD-10-CM

## 2014-08-26 DIAGNOSIS — D128 Benign neoplasm of rectum: Secondary | ICD-10-CM

## 2014-08-26 DIAGNOSIS — D121 Benign neoplasm of appendix: Secondary | ICD-10-CM

## 2014-08-26 DIAGNOSIS — D129 Benign neoplasm of anus and anal canal: Secondary | ICD-10-CM

## 2014-08-26 DIAGNOSIS — Z1211 Encounter for screening for malignant neoplasm of colon: Secondary | ICD-10-CM

## 2014-08-26 LAB — GLUCOSE, CAPILLARY
GLUCOSE-CAPILLARY: 118 mg/dL — AB (ref 70–99)
GLUCOSE-CAPILLARY: 89 mg/dL (ref 70–99)

## 2014-08-26 MED ORDER — SODIUM CHLORIDE 0.9 % IV SOLN: 500.0000 mL | INTRAVENOUS | Status: DC

## 2014-08-26 NOTE — Op Note (Signed)
[place]  [address] [place], [id number]   COLONOSCOPY PROCEDURE REPORT  PATIENT: Lisa, Hanson  MR#: [id number] BIRTHDATE: 1960-09-04 , 27  yrs. old GENDER: female ENDOSCOPIST: Milus Banister, MD REFERRED TG:GYIRSWNI Lisa Hanson, M.D. PROCEDURE DATE:  08/26/2014 PROCEDURE:   Colonoscopy with biopsy and Colonoscopy with snare polypectomy First Screening Colonoscopy - Avg.  risk and is 50 yrs.  old or older Yes.  Prior Negative Screening - Now for repeat screening. N/A  History of Adenoma - Now for follow-up colonoscopy & has been > or = to 3 yrs.  N/A  Polyps Removed Today? Yes. ASA CLASS:   Class II INDICATIONS:average risk for colon cancer. MEDICATIONS: Monitored anesthesia care and Propofol 200 mg IV  DESCRIPTION OF PROCEDURE:   After the risks benefits and alternatives of the procedure were thoroughly explained, informed consent was obtained.  The digital rectal exam revealed no abnormalities of the rectum.   The LB [id number]  endoscope was introduced through the anus and advanced to the cecum, which was identified by both the appendix and ileocecal valve. No adverse events experienced.   The quality of the prep was excellent.  The instrument was then slowly withdrawn as the colon was fully examined.  COLON FINDINGS: The appendiceal orifice was somewhat abnormal appearing circumferentially.  It was edematous, a bit protuberent, the mucosa slightly neoplastic appearing and this was biopsied. One polyp was found, removed and sent to pathology.  This was sessile, 42mm across, located in rectum, removed with cold snare. The examination was otherwise normal.  Retroflexed views revealed no abnormalities. The time to cecum=3 minutes 21 seconds. Withdrawal time=11 minutes 09 seconds.  The scope was withdrawn and the procedure completed. COMPLICATIONS: There were no immediate complications.  ENDOSCOPIC IMPRESSION: 1. The appendiceal orifice was somewhat  abnormal appearing circumferentially.  It was edematous, a bit protuberent, the mucosa slightly neoplastic appearing and this was biopsied. 2. One polyp was found, removed and sent to pathology. 3. The examination was otherwise normal  RECOMMENDATIONS: 1. If the polyp(s) removed today are proven to be adenomatous (pre-cancerous) polyps, you will need a repeat colonoscopy in 5 years.  Otherwise you should continue to follow colorectal cancer screening guidelines for "routine risk" patients with colonoscopy in 10 years.  You will receive a letter within 1-2 weeks with the results of your biopsy as well as final recommendations.  Please call my office if you have not received a letter after 3 weeks. 2. Await pathology results from abnormal appendiceal orifice as well.  eSigned:  Milus Banister, MD 08/26/2014 11:32 AM

## 2014-08-26 NOTE — Progress Notes (Signed)
Report to PACU, RN, vss, BBS= Clear.  

## 2014-08-26 NOTE — Progress Notes (Signed)
Called to room to assist during endoscopic procedure.  Patient ID and intended procedure confirmed with present staff. Received instructions for my participation in the procedure from the performing physician.  

## 2014-08-26 NOTE — Patient Instructions (Signed)

## 2014-08-27 ENCOUNTER — Telehealth: Payer: Self-pay | Admitting: *Deleted

## 2014-08-27 NOTE — Telephone Encounter (Signed)
  Follow up Call-  Call back number 08/26/2014  Post procedure Call Back phone  # [phone]  Permission to leave phone message Yes     Patient questions:  Do you have a fever, pain , or abdominal swelling? No. Pain Score  0 *  Have you tolerated food without any problems? Yes.    Have you been able to return to your normal activities? Yes.    Do you have any questions about your discharge instructions: Diet   No. Medications  No. Follow up visit  No.  Do you have questions or concerns about your Care? No.  Actions: * If pain score is 4 or above: No action needed, pain <4.

## 2014-09-03 ENCOUNTER — Other Ambulatory Visit: Payer: Self-pay

## 2014-09-03 DIAGNOSIS — R9389 Abnormal findings on diagnostic imaging of other specified body structures: Secondary | ICD-10-CM

## 2014-09-04 NOTE — Addendum Note (Signed)
Addended by: Barron Alvine on: 09/04/2014 03:24 PM   Modules accepted: Orders

## 2014-09-04 NOTE — Addendum Note (Signed)
Addended by: Barron Alvine on: 09/04/2014 03:15 PM   Modules accepted: Orders

## 2014-09-05 ENCOUNTER — Other Ambulatory Visit (INDEPENDENT_AMBULATORY_CARE_PROVIDER_SITE_OTHER): Payer: 59

## 2014-09-05 DIAGNOSIS — K389 Disease of appendix, unspecified: Secondary | ICD-10-CM

## 2014-09-05 DIAGNOSIS — D128 Benign neoplasm of rectum: Secondary | ICD-10-CM

## 2014-09-05 DIAGNOSIS — Z1211 Encounter for screening for malignant neoplasm of colon: Secondary | ICD-10-CM

## 2014-09-05 DIAGNOSIS — D129 Benign neoplasm of anus and anal canal: Secondary | ICD-10-CM

## 2014-09-05 LAB — BUN: BUN: 16 mg/dL (ref 6–23)

## 2014-09-05 LAB — CREATININE, SERUM: Creatinine, Ser: 0.8 mg/dL (ref 0.4–1.2)

## 2014-09-06 ENCOUNTER — Ambulatory Visit (INDEPENDENT_AMBULATORY_CARE_PROVIDER_SITE_OTHER)
Admission: RE | Admit: 2014-09-06 | Discharge: 2014-09-06 | Disposition: A | Discharge status: Home / Self Care | Payer: 59 | Source: Ambulatory Visit | Attending: Gastroenterology | Admitting: Gastroenterology

## 2014-09-06 DIAGNOSIS — D129 Benign neoplasm of anus and anal canal: Secondary | ICD-10-CM

## 2014-09-06 DIAGNOSIS — Z1211 Encounter for screening for malignant neoplasm of colon: Secondary | ICD-10-CM

## 2014-09-06 DIAGNOSIS — K389 Disease of appendix, unspecified: Secondary | ICD-10-CM

## 2014-09-06 DIAGNOSIS — D128 Benign neoplasm of rectum: Secondary | ICD-10-CM

## 2014-09-06 MED ORDER — IOHEXOL 300 MG/ML  SOLN
100.0000 mL | Freq: Once | INTRAMUSCULAR | Status: AC | PRN
  Administered 2014-09-06: 100 mL via INTRAVENOUS

## 2014-09-16 ENCOUNTER — Other Ambulatory Visit (INDEPENDENT_AMBULATORY_CARE_PROVIDER_SITE_OTHER): Payer: Self-pay | Admitting: Surgery

## 2014-09-16 NOTE — H&P (Signed)
Lisa Hanson 09/16/2014 9:55 AM Location: [facility] Patient #: [id number] DOB: 1961/05/01 Single / Language: Lisa Hanson / Race: Black or African American Female History of Present Illness Lisa Hector MD; 09/16/2014 10:25 AM) Patient words: appy.  The patient is a 54 year old female who presents with a colonic polyp. Patient sent for surgical consultation by Dr. Oretha Hanson over concern of persistent polyp at appendiceal orifice. Need for extended appendectomy versus ileus ileocectomy. Pleasant active woman. No family history of colon cancer. Underwent screening colonoscopy this year at age 24. Mass found at appendiceal orifice. Atypical tissue. Biopsy has hyperplastic and adenomatous features. CT scan does not really cancer tumor. Not able to pull films up. Because of adenomatous changes and mucosa and appendiceal off orifice, surgical consultation requested to see if appendectomy versus ileus ileocectomy warranted. Patient claims she had her appendix removed when she had her hysterectomy at [place]. She seemed surprised that the appendix is still there. Normally has BM about every day. Can walk several miles without difficulty. She does not smoke. She is an insulin-requiring diabetic followed by Lisa Hanson. Hemoglobin A1c 8.2. Improving. No other abdominal surgeries. No history of stroke or heart attack. No history of Crohn's or colitis. No inflammatory bowel disease. No irritable bowel syndrome. No sick contacts or travel history. Other Problems Lisa Hanson, CMA; 09/16/2014 9:55 AM) Diabetes Mellitus Hypercholesterolemia Oophorectomy  Past Surgical History Lisa Hanson, CMA; 09/16/2014 9:55 AM) Colon Polyp Removal - Colonoscopy Foot Surgery Left. Hysterectomy (not due to cancer) - Complete  Diagnostic Studies History Lisa Hanson, CMA; 09/16/2014 9:55 AM) Colonoscopy within last year Mammogram within last year Pap Smear 1-5 years  ago  Allergies Lisa Hanson, Lisa Hanson; 09/16/2014 9:56 AM) No Known Drug Allergies 09/16/2014  Medication History (Lisa Hanson, CMA; 09/16/2014 9:57 AM) Lisa Hanson (50-1000MG  Tablet, Oral) Active. Lisinopril (5MG  Tablet, Oral) Active. NovoLOG Mix 70/30 FlexPen ((70-30) 100UNIT/ML Susp Pen-inj, Subcutaneous) Active. Crestor (40MG  Tablet, Oral) Active. BD Pen Needle Nano U/F (32G X 4 MM Misc,) Active.  Pregnancy / Birth History Lisa Hanson, [place]; 09/16/2014 9:55 AM) Age at menarche 1 years. Contraceptive History Oral contraceptives. Gravida 2 Maternal age 60-25 Para 1 Regular periods     Review of Systems Lisa Hanson CMA; 09/16/2014 9:55 AM) General Not Present- Appetite Loss, Chills, Fatigue, Fever, Night Sweats, Weight Gain and Weight Loss. Skin Not Present- Change in Wart/Mole, Dryness, Hives, Jaundice, New Lesions, Non-Healing Wounds, Rash and Ulcer. HEENT Present- Wears glasses/contact lenses. Not Present- Earache, Hearing Loss, Hoarseness, Nose Bleed, Oral Ulcers, Ringing in the Ears, Seasonal Allergies, Sinus Pain, Sore Throat, Visual Disturbances and Yellow Eyes. Respiratory Not Present- Bloody sputum, Chronic Cough, Difficulty Breathing, Snoring and Wheezing. Breast Not Present- Breast Mass, Breast Pain, Nipple Discharge and Skin Changes. Cardiovascular Not Present- Chest Pain, Difficulty Breathing Lying Down, Leg Cramps, Palpitations, Rapid Heart Rate, Shortness of Breath and Swelling of Extremities. Gastrointestinal Not Present- Abdominal Pain, Bloating, Bloody Stool, Change in Bowel Habits, Chronic diarrhea, Constipation, Difficulty Swallowing, Excessive gas, Gets full quickly at meals, Hemorrhoids, Indigestion, Nausea, Rectal Pain and Vomiting. Female Genitourinary Not Present- Frequency, Nocturia, Painful Urination, Pelvic Pain and Urgency. Musculoskeletal Not Present- Back Pain, Joint Pain, Joint Stiffness, Muscle Pain, Muscle Weakness and Swelling of  Extremities. Neurological Not Present- Decreased Memory, Fainting, Headaches, Numbness, Seizures, Tingling, Tremor, Trouble walking and Weakness. Psychiatric Not Present- Anxiety, Bipolar, Change in Sleep Pattern, Depression, Fearful and Frequent crying. Endocrine Not Present- Cold Intolerance, Excessive Hunger, Hair Changes, Heat Intolerance, Hot flashes and  New Diabetes. Hematology Not Present- Easy Bruising, Excessive bleeding, Gland problems, HIV and Persistent Infections.  Vitals (Lisa Hanson CMA; 09/16/2014 9:56 AM) 09/16/2014 9:56 AM Weight: 193 lb Height: 67in Body Surface Area: 2.03 m Body Mass Index: 30.23 kg/m Temp.: 23F(Temporal)  Pulse: 75 (Regular)  BP: 130/76 (Sitting, Left Arm, Standard)     Physical Exam Lisa Hector MD; 09/16/2014 10:18 AM)  General Mental Status-Alert. General Appearance-Not in acute distress, Not Sickly. Orientation-Oriented X3. Hydration-Well hydrated. Voice-Normal.  Integumentary Global Assessment Upon inspection and palpation of skin surfaces of the - Axillae: non-tender, no inflammation or ulceration, no drainage. and Distribution of scalp and body hair is normal. General Characteristics Temperature - normal warmth is noted.  Head and Neck Head-normocephalic, atraumatic with no lesions or palpable masses. Face Global Assessment - atraumatic, no absence of expression. Neck Global Assessment - no abnormal movements, no bruit auscultated on the right, no bruit auscultated on the left, no decreased range of motion, non-tender. Trachea-midline. Thyroid Gland Characteristics - non-tender.  Eye Eyeball - Left-Extraocular movements intact, No Nystagmus. Eyeball - Right-Extraocular movements intact, No Nystagmus. Cornea - Left-No Hazy. Cornea - Right-No Hazy. Sclera/Conjunctiva - Left-No scleral icterus, No Discharge. Sclera/Conjunctiva - Right-No scleral icterus, No Discharge. Pupil -  Left-Direct reaction to light normal. Pupil - Right-Direct reaction to light normal.  ENMT Ears Pinna - Left - no drainage observed, no generalized tenderness observed. Right - no drainage observed, no generalized tenderness observed. Nose and Sinuses External Inspection of the Nose - no destructive lesion observed. Inspection of the nares - Left - quiet respiration. Right - quiet respiration. Mouth and Throat Lips - Upper Lip - no fissures observed, no pallor noted. Lower Lip - no fissures observed, no pallor noted. Nasopharynx - no discharge present. Oral Cavity/Oropharynx - Tongue - no dryness observed. Oral Mucosa - no cyanosis observed. Hypopharynx - no evidence of airway distress observed.  Chest and Lung Exam Inspection Movements - Normal and Symmetrical. Accessory muscles - No use of accessory muscles in breathing. Palpation Palpation of the chest reveals - Non-tender. Auscultation Breath sounds - Normal and Clear.  Cardiovascular Auscultation Rhythm - Regular. Murmurs & Other Heart Sounds - Auscultation of the heart reveals - No Murmurs and No Systolic Clicks.  Abdomen Inspection Inspection of the abdomen reveals - No Visible peristalsis and No Abnormal pulsations. Umbilicus - No Bleeding, No Urine drainage. Palpation/Percussion Palpation and Percussion of the abdomen reveal - Soft, Non Tender, No Rebound tenderness, No Rigidity (guarding) and No Cutaneous hyperesthesia.  Female Genitourinary Sexual Maturity Tanner 5 - Adult hair pattern. Note: No vaginal bleeding nor discharge   Peripheral Vascular Upper Extremity Inspection - Left - No Cyanotic nailbeds, Not Ischemic. Right - No Cyanotic nailbeds, Not Ischemic.  Neurologic Neurologic evaluation reveals -normal attention span and ability to concentrate, able to name objects and repeat phrases. Appropriate fund of knowledge , normal sensation and normal coordination. Mental Status Affect - not angry, not  paranoid. Cranial Nerves-Normal Bilaterally. Gait-Normal.  Neuropsychiatric Mental status exam performed with findings of-able to articulate well with normal speech/language, rate, volume and coherence, thought content normal with ability to perform basic computations and apply abstract reasoning and no evidence of hallucinations, delusions, obsessions or homicidal/suicidal ideation.  Musculoskeletal Global Assessment Spine, Ribs and Pelvis - no instability, subluxation or laxity. Right Upper Extremity - no instability, subluxation or laxity.  Lymphatic Head & Neck  General Head & Neck Lymphatics: Bilateral - Description - No Localized lymphadenopathy. Axillary  General Axillary Region:  Bilateral - Description - No Localized lymphadenopathy. Femoral & Inguinal  Generalized Femoral & Inguinal Lymphatics: Left - Description - No Localized lymphadenopathy. Right - Description - No Localized lymphadenopathy.    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 Not able to be removed endoscopically completely. Patient claims that her appendix was removed with her hysterectomy in 1992 but CT scan confirms "normal appendix". I cannot pull up the films as an system is down right now. We'll look again later. Perhaps just a stump. Nonetheless there is abnormal tissue at the orifice.  Looking at the endoscopy pictures, the abnormal seems to have not spread too far out the appendiceal orifice. Hopefully a appendectomy with cecal wall wedge resection is all that needed. Most likely we'll start with that and check specimen in the OR to see if I have good margins around. So far no high-grade features or ulcerations or nodularity or suspicious on CAT scan to make more aggressive resection initially warranted. May require ileocecectomy if I  cannot get good margins without compromising the ileocecal valve.  Patient is interested in proceeding. Reasonable to have a minimally invasive approach. We'll hope for just an appendectomy but will prep and set up an case ileus septectomy needed.  Current Plans Schedule for Surgery Pt Education - [facility] Colon Bowel Prep 2015 Miralax/Antibiotics Started Neomycin Sulfate 500MG , 2 (two) Tablet SEE NOTE, #6, 09/16/2014, No Refill. Local Order: TAKE TWO TABLETS AT 2 PM, 3 PM, AND 10 PM THE DAY PRIOR TO SURGERY Started Flagyl 500MG , 2 (two) Tablet SEE NOTE, #6, 09/16/2014, No Refill. Local Order: Take at 2pm, 3pm, and 10pm the day prior to your colon operation The anatomy & physiology of the digestive tract was discussed. The pathophysiology of appendicitis and other appendiceal disorders were discussed. Natural history risks without surgery was discussed. I feel the risks of no intervention will lead to serious problems that outweigh the operative risks; therefore, I recommended diagnostic laparoscopy with removal of appendix to remove the pathology. Laparoscopic & open techniques were discussed. I noted a good likelihood this will help address the problem. Risks such as bleeding, infection, abscess, leak, reoperation, possible ostomy, hernia, heart attack, death, and other risks were discussed. Goals of post-operative recovery were discussed as well. We will work to minimize complications. Questions were answered. The patient expresses understanding & wishes to proceed with surgery. The anatomy & physiology of the digestive tract was discussed. The pathophysiology of the colon was discussed. Natural history risks without surgery was discussed. I feel the risks of no intervention will lead to serious problems that outweigh the operative risks; therefore, I recommended a partial colectomy to remove the pathology. Minimally invasive (Robotic/Laparoscopic) & open techniques were discussed.  Risks such as  bleeding, infection, abscess, leak, reoperation, possible ostomy, hernia, heart attack, death, and other risks were discussed. I noted a good likelihood this will help address the problem. Goals of post-operative recovery were discussed as well. Need for adequate nutrition, daily bowel regimen and healthy physical activity, to optimize recovery was noted as well. We will work to minimize complications. Educational materials were available as well. Questions were answered. The patient expresses understanding & wishes to proceed with surgery. Pt Education - [facility] Laparosopic Post Op HCI (Courtnei Ruddell) Pt Education - [facility] Pain Control (Elijahjames Fuelling) Pt Education - [place] (Temprence Rhines)

## 2014-10-15 ENCOUNTER — Ambulatory Visit: Payer: Self-pay

## 2014-10-15 ENCOUNTER — Encounter: Payer: Self-pay | Admitting: Family Medicine

## 2014-10-15 ENCOUNTER — Ambulatory Visit (INDEPENDENT_AMBULATORY_CARE_PROVIDER_SITE_OTHER): Payer: 59 | Admitting: Family Medicine

## 2014-10-15 VITALS — BP 104/60 | HR 71 | Temp 98.0°F | Resp 16 | Ht 66.5 in | Wt 191.8 lb

## 2014-10-15 DIAGNOSIS — M79641 Pain in right hand: Secondary | ICD-10-CM

## 2014-10-15 NOTE — Patient Instructions (Signed)
Wear splint for comfort- remove several times a day and do gentle range of motion exercises so you don't get stiff Can take Alleve 2 tablets twice a day Heat as needed Follow up in 1 month, if no better we can refer you to a hand specialist.

## 2014-10-15 NOTE — Progress Notes (Signed)
   Subjective:    Patient ID: Lisa Hanson, female    DOB: 1961/08/22, 54 y.o.   MRN: [id number]  HPI Patient presents today with right hand pain. It has been hurting off and on for at least a month. She got in the car yesterday and sat on her hand which caused her a great deal of pain and she noticed some swelling, which she hadn't noticed before. No known injury. Has been taking Alleve 2 a day alternating with ibuprofen 800 mg without relief. 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 Pain worse at end of her shift.   She had similar pain in her left hand that eventually resolved spontaneously. She tried meloxicam in the past without good relief.   A1C has come down and she is doing better with her eating and exercise. Sees endocrinologist. Has lost 4 pounds. She is working second shift instead of third shift and this is better for her.   Review of Systems No injury, no weakness, no numbness or tingling. No other joint pain.    Objective:   Physical Exam  Constitutional: She is oriented to person, place, and time. She appears well-developed and well-nourished.  HENT:  Head: Normocephalic and atraumatic.  Eyes: Conjunctivae are normal.  Neck: Normal range of motion. Neck supple.  Cardiovascular: Normal rate.   Pulmonary/Chest: Effort normal.  Musculoskeletal: Normal range of motion.       Right hand: She exhibits tenderness (Right thumb MC joint). She exhibits normal range of motion, no deformity and no swelling. Normal sensation noted. Normal strength noted.  Neurological: She is alert and oriented to person, place, and time.  Skin: Skin is warm and dry.  Psychiatric: She has a normal mood and affect. Her behavior is normal. Judgment and thought content normal.  Vitals reviewed. BP 104/60 mmHg  Pulse 71  Temp(Src) 98 F (36.7 C) (Oral)  Resp 16  Ht 5' 6.5" (1.689 m)  Wt 191 lb 12.8 oz (87 kg)  BMI 30.50 kg/m2  SpO2 98%     Assessment & Plan:  1. Right hand pain -Not  currently swollen, low suspicion for acute abnormality -Thumb spica splint applied and patient given instructions on use- remove several times a day and do ROM -Can try Alleve 2 tablets twice a day and continue heat as needed -Follow up in 1 month, if no improvement, can refer to ortho/hand.   Elby Beck, FNP-BC  [facility]  10/15/2014 1:23 PM

## 2014-11-12 ENCOUNTER — Encounter: Payer: Self-pay | Admitting: Family Medicine

## 2014-11-12 ENCOUNTER — Ambulatory Visit (INDEPENDENT_AMBULATORY_CARE_PROVIDER_SITE_OTHER): Payer: 59 | Admitting: Family Medicine

## 2014-11-12 VITALS — BP 98/60 | HR 83 | Temp 98.2°F | Resp 16 | Ht 67.5 in | Wt 190.0 lb

## 2014-11-12 DIAGNOSIS — M79641 Pain in right hand: Secondary | ICD-10-CM

## 2014-11-12 NOTE — Progress Notes (Signed)
   Subjective:    Patient ID: Lisa Hanson, female    DOB: [birth year], 54 y.o.   MRN: [id number]  HPI This is a pleasant 54 yo female who presents today for follow up of right hand pain. She was seen 10/15/14 with pain and swelling at the base of her right thumb. She has tried Alleve and thumb spica splint with no improvement of pain.   She continues to see her endocrinologist for management of her diabetes and reports that her HgA1C has come down significantly and she has had improvements in her lipid profile as well.  She had a "stomach bug," 3 days ago with vomiting and diarrhea for several hours. Resolved spontaneously. She feels a little weak, but has returned to normal activities.   Review of Systems No falls, no chest pain, no SOB, no fever, no dizziness.     Objective:   Physical Exam  Constitutional: She is oriented to person, place, and time. She appears well-developed and well-nourished.  HENT:  Head: Normocephalic.  Eyes: Conjunctivae are normal.  Neck: Normal range of motion. Neck supple.  Cardiovascular: Normal rate.   Pulmonary/Chest: Effort normal.  Musculoskeletal:       Right hand: She exhibits tenderness and swelling (mild). She exhibits normal range of motion and normal capillary refill. Normal sensation noted. Normal strength noted.  Neurological: She is alert and oriented to person, place, and time.  Skin: Skin is warm and dry.  Psychiatric: She has a normal mood and affect. Her behavior is normal. Judgment and thought content normal.  Vitals reviewed. BP 98/60 mmHg  Pulse 83  Temp(Src) 98.2 F (36.8 C) (Oral)  Resp 16  Ht 5' 7.5" (1.715 m)  Wt 190 lb (86.183 kg)  BMI 29.30 kg/m2  SpO2 99%    Assessment & Plan:  1. Right hand pain - No improvement with conservative therapy of otc NSAIDs and splinting - Ambulatory referral to [place], FNP-BC  [facility]  11/12/2014 10:06  PM

## 2015-08-06 ENCOUNTER — Emergency Department (HOSPITAL_COMMUNITY)
Admission: EM | Admit: 2015-08-06 | Discharge: 2015-08-06 | Disposition: A | Discharge status: Home / Self Care | Payer: 59 | Source: Home / Self Care | Attending: Emergency Medicine | Admitting: Emergency Medicine

## 2015-08-06 ENCOUNTER — Encounter (HOSPITAL_COMMUNITY): Payer: Self-pay | Admitting: Emergency Medicine

## 2015-08-06 DIAGNOSIS — M545 Low back pain, unspecified: Secondary | ICD-10-CM

## 2015-08-06 MED ORDER — PREDNISONE 50 MG PO TABS: ORAL_TABLET | ORAL | Status: DC

## 2015-08-06 MED ORDER — KETOROLAC TROMETHAMINE 60 MG/2ML IM SOLN
60.0000 mg | Freq: Once | INTRAMUSCULAR | Status: AC
  Administered 2015-08-06: 60 mg via INTRAMUSCULAR

## 2015-08-06 MED ORDER — KETOROLAC TROMETHAMINE 60 MG/2ML IM SOLN
INTRAMUSCULAR | Status: AC
  Filled 2015-08-06: qty 2

## 2015-08-06 MED ORDER — CYCLOBENZAPRINE HCL 5 MG PO TABS: 5.0000 mg | ORAL_TABLET | Freq: Every evening | ORAL | Status: DC | PRN

## 2015-08-06 NOTE — Discharge Instructions (Signed)
You tweaked your back. Take prednisone daily for the next 5 days. Please monitor your blood sugar carefully while taking this medicine. Take ibuprofen regularly for the next 3-4 days. Take Flexeril at bedtime. This medicine will make you drowsy. Alternate ice and heat to the area. This should improve over the next 3-4 days. Follow-up as needed.

## 2015-08-06 NOTE — ED Notes (Signed)
Pt here with sudden left lower back pain flare up after wrong movement today Occurred around 12pm  Pt is following Chiropractor for back problems since October No pain meds taken

## 2015-08-06 NOTE — ED Provider Notes (Signed)
CSN: [id number]     Arrival date & time 08/06/15  1417 History   First MD Initiated Contact with Patient 08/06/15 1507     Chief Complaint  Patient presents with  . Back Pain   (Consider location/radiation/quality/duration/timing/severity/associated sxs/prior Treatment) HPI She is a 54 year old woman here for evaluation of left lower back pain. She states she has been having trouble with back pain on and off for the last 6 months. She has been seeing a Restaurant manager, fast food and been doing well up until noon today. She states she stepped wrong and felt her left lower back seize up. She states the pain radiated into her left buttock and then back up and settled in her left lower back. She denies radiation at this time. No numbness, tingling, weakness in her lower extremities. No bowel or bladder incontinence. She is not tried any medications yet.   Past Medical History  Diagnosis Date  . Diabetes mellitus    Past Surgical History  Procedure Laterality Date  . Abdominal hysterectomy    . Ankle Left     reconstructive surgery  . Foot osteotomy w/ plantar fascia release     Family History  Problem Relation Age of Onset  . Diabetes Mother   . Heart failure Mother   . Diabetes Father   . Colon cancer Neg Hx    Social History  Substance Use Topics  . Smoking status: Never Smoker   . Smokeless tobacco: Never Used  . Alcohol Use: No   OB History    No data available     Review of Systems As in history of present illness Allergies  Review of patient's allergies indicates no known allergies.  Home Medications   Prior to Admission medications   Medication Sig Start Date End Date Taking? Authorizing Provider  Canagliflozin-Metformin HCl (INVOKAMET) 50-500 MG TABS Take 1 tablet by mouth 2 (two) times daily.    Historical Provider, MD  Cholecalciferol (VITAMIN D3) 3000 UNITS TABS Take 2,000 Units by mouth daily.    Historical Provider, MD  cyclobenzaprine (FLEXERIL) 5 MG tablet Take 1 tablet  (5 mg total) by mouth at bedtime as needed for muscle spasms. 08/06/15   Melony Overly, MD  Insulin Aspart Prot & Aspart (NOVOLOG MIX 70/30 Bath) Inject into the skin 2 (two) times daily. Pt dosage 70-25-40    Historical Provider, MD  lisinopril (PRINIVIL,ZESTRIL) 10 MG tablet Take 10 mg by mouth daily.    Historical Provider, MD  predniSONE (DELTASONE) 50 MG tablet Take 1 pill daily for 5 days. 08/06/15   Melony Overly, MD  rosuvastatin (CRESTOR) 10 MG tablet Take 10 mg by mouth daily.    Historical Provider, MD   Meds Ordered and Administered this Visit   Medications  ketorolac (TORADOL) injection 60 mg (not administered)    BP 141/69 mmHg  Pulse 85  Temp(Src) 98.4 F (36.9 C) (Oral)  SpO2 96% No data found.   Physical Exam  Constitutional: She is oriented to person, place, and time. She appears well-developed and well-nourished. No distress.  Cardiovascular: Normal rate.   Pulmonary/Chest: Effort normal.  Musculoskeletal:       Back:  5 out of 5 strength in lower extremities. She is slightly weak in left hip flexion due to pain. No midline tenderness.  Neurological: She is alert and oriented to person, place, and time.    ED Course  Procedures (including critical care time)  Labs Review Labs Reviewed - No data to display  Imaging  Review No results found.   MDM   1. Left-sided low back pain without sciatica    Toradol given here for pain. Treat with 5 days of prednisone. She has ibuprofen at home, recommended she take this regularly for the next 4 days. Prescription for Flexeril given to use at bedtime as well. Follow-up as needed.    Melony Overly, MD 08/06/15 [id number]

## 2016-08-09 ENCOUNTER — Ambulatory Visit (INDEPENDENT_AMBULATORY_CARE_PROVIDER_SITE_OTHER): Payer: 59 | Admitting: Family Medicine

## 2016-08-09 ENCOUNTER — Ambulatory Visit (INDEPENDENT_AMBULATORY_CARE_PROVIDER_SITE_OTHER): Payer: 59

## 2016-08-09 VITALS — BP 122/72 | HR 75 | Temp 98.2°F | Resp 17 | Ht 67.5 in | Wt 207.0 lb

## 2016-08-09 DIAGNOSIS — M79642 Pain in left hand: Secondary | ICD-10-CM

## 2016-08-09 DIAGNOSIS — M25532 Pain in left wrist: Secondary | ICD-10-CM

## 2016-08-09 MED ORDER — MELOXICAM 15 MG PO TABS: 15.0000 mg | ORAL_TABLET | Freq: Every day | ORAL | 1 refills | Status: DC

## 2016-08-09 MED ORDER — TRAMADOL HCL 50 MG PO TABS: 50.0000 mg | ORAL_TABLET | Freq: Three times a day (TID) | ORAL | 0 refills | Status: DC | PRN

## 2016-08-09 NOTE — Progress Notes (Signed)
Patient ID: ANNAALICIA VIELMAN, female    DOB: Jan 17, 1961, 55 y.o.   MRN: [id number]  PCP: Jacelyn Pi, MD  Chief Complaint  Patient presents with  . Hand Injury    Subjective:   HPI 55 year old female presents for evaluation of left hand injury x 1 day. Fell with full body weight with open hand off the side walk. Did not injury head and scarped both knees Numbness through the middle finger , sharp pain below the thumb and going down the left wrist. She applied ice last night, today reports difficulty gripping objects and struggled getting dressed. No previous left hand or wrist injury.   Social History   Social History  . Marital status: Single    Spouse name: N/A  . Number of children: N/A  . Years of education: N/A   Occupational History  . Not on file.   Social History Main Topics  . Smoking status: Never Smoker  . Smokeless tobacco: Never Used  . Alcohol use No  . Drug use: No  . Sexual activity: No   Other Topics Concern  . Not on file   Social History Narrative  . No narrative on file   Family History  Problem Relation Age of Onset  . Diabetes Mother   . Heart failure Mother   . Diabetes Father   . Colon cancer Neg Hx    Review of Systems See HPI   Patient Active Problem List   Diagnosis Date Noted  . Strain of peroneal tendon 06/28/2012  . Diabetes mellitus 03/10/2011     Prior to Admission medications   Medication Sig Start Date End Date Taking? Authorizing Provider  Canagliflozin-Metformin HCl (INVOKAMET) 50-500 MG TABS Take 1 tablet by mouth 2 (two) times daily.   Yes Historical Provider, MD  Insulin Aspart Prot & Aspart (NOVOLOG MIX 70/30 [place]) Inject into the skin 2 (two) times daily. Pt dosage 70-25-40   Yes Historical Provider, MD  lisinopril (PRINIVIL,ZESTRIL) 10 MG tablet Take 10 mg by mouth daily.   Yes Historical Provider, MD  rosuvastatin (CRESTOR) 10 MG tablet Take 10 mg by mouth daily.   Yes Historical Provider, MD   No Known  Allergies    Objective:  Physical Exam  Constitutional: She is oriented to person, place, and time. She appears well-developed and well-nourished.  HENT:  Head: Normocephalic and atraumatic.  Right Ear: External ear normal.  Left Ear: External ear normal.  Eyes: Conjunctivae and EOM are normal. Pupils are equal, round, and reactive to light.  Cardiovascular: Normal rate, regular rhythm, normal heart sounds and intact distal pulses.   Pulmonary/Chest: Effort normal and breath sounds normal.  Musculoskeletal: She exhibits tenderness.  Neurological: She is alert and oriented to person, place, and time.  Decreased hand grip strength left hand c/t right hand   Skin: Skin is warm and dry.    Vitals:   08/09/16 1145  BP: 122/72  Pulse: 75  Resp: 17  Temp: 98.2 F (36.8 C)   Dg Wrist Complete Left  Result Date: 08/09/2016 CLINICAL DATA:  Hand and wrist pain secondary to a fall last night. EXAM: LEFT WRIST - COMPLETE 3+ VIEW COMPARISON:  None. FINDINGS: There is no evidence of fracture or dislocation. Severe arthritic changes at the first carpometacarpal joint with flattening and erosion of the anus trapezium and osteophyte formation. No acute abnormalities. 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 Electronically Signed   By: Lorriane Shire M.D.   On:  08/09/2016 13:00   Dg Hand 2 View Left  Result Date: 08/09/2016 CLINICAL DATA:  Left hand and wrist pain secondary to a fall last night. EXAM: LEFT HAND - 2 VIEW COMPARISON:  None. FINDINGS: There is no fracture or dislocation. There is severe arthritis at the first carpometacarpal joint with erosion and osteophyte formation on the trapezium. IMPRESSION: No acute abnormality. Severe arthritic changes at the first carpometacarpal joint. Subsequent deformity of the trapezium. Electronically Signed   By: Lorriane Shire M.D.   On: 08/09/2016 12:59   Assessment & Plan:  1. Left hand pain - DG Hand 2 View  2. Left wrist  pain - DG Wrist Complete  Plan: Take Meloxicam 15 mg once daily with food to relieve inflammation and pain. Take Tramadol 50 mg every 8 hours as needed for pain. Continue ice compresses to reduce swelling.  Carroll Sage. Kenton Kingfisher, MSN, FNP-C [facility]

## 2016-08-09 NOTE — Patient Instructions (Addendum)
Meloxicam 15 mg once daily with food to relieve inflammation and pain.  Tramadol 50 mg every 8 hours as needed for pain.    IF you received an x-ray today, you will receive an invoice from [facility]. Please contact [facility] at [phone] with questions or concerns regarding your invoice.   IF you received labwork today, you will receive an invoice from [employer]. Please contact [facility] at [phone] with questions or concerns regarding your invoice.   Our billing staff will not be able to assist you with questions regarding bills from these companies.  You will be contacted with the lab results as soon as they are available. The fastest way to get your results is to activate your My Chart account. Instructions are located on the last page of this paperwork. If you have not heard from [place] regarding the results in 2 weeks, please contact this office.

## 2016-11-10 DIAGNOSIS — H43823 Vitreomacular adhesion, bilateral: Secondary | ICD-10-CM | POA: Diagnosis not present

## 2016-11-10 DIAGNOSIS — E113513 Type 2 diabetes mellitus with proliferative diabetic retinopathy with macular edema, bilateral: Secondary | ICD-10-CM | POA: Diagnosis not present

## 2016-12-08 ENCOUNTER — Emergency Department (HOSPITAL_COMMUNITY): Payer: 59

## 2016-12-08 ENCOUNTER — Encounter (HOSPITAL_COMMUNITY): Payer: Self-pay | Admitting: Emergency Medicine

## 2016-12-08 ENCOUNTER — Emergency Department (HOSPITAL_COMMUNITY)
Admission: EM | Admit: 2016-12-08 | Discharge: 2016-12-08 | Disposition: A | Discharge status: Home / Self Care | Payer: 59 | Attending: Physician Assistant | Admitting: Physician Assistant

## 2016-12-08 DIAGNOSIS — E119 Type 2 diabetes mellitus without complications: Secondary | ICD-10-CM | POA: Insufficient documentation

## 2016-12-08 DIAGNOSIS — R55 Syncope and collapse: Secondary | ICD-10-CM | POA: Insufficient documentation

## 2016-12-08 DIAGNOSIS — Z91040 Latex allergy status: Secondary | ICD-10-CM | POA: Diagnosis not present

## 2016-12-08 DIAGNOSIS — Z794 Long term (current) use of insulin: Secondary | ICD-10-CM | POA: Diagnosis not present

## 2016-12-08 DIAGNOSIS — R7989 Other specified abnormal findings of blood chemistry: Secondary | ICD-10-CM | POA: Diagnosis not present

## 2016-12-08 DIAGNOSIS — R0602 Shortness of breath: Secondary | ICD-10-CM | POA: Diagnosis not present

## 2016-12-08 DIAGNOSIS — R079 Chest pain, unspecified: Secondary | ICD-10-CM | POA: Diagnosis not present

## 2016-12-08 DIAGNOSIS — R404 Transient alteration of awareness: Secondary | ICD-10-CM | POA: Diagnosis not present

## 2016-12-08 LAB — URINALYSIS, ROUTINE W REFLEX MICROSCOPIC
Bilirubin Urine: NEGATIVE
Glucose, UA: 50 mg/dL — AB
Ketones, ur: NEGATIVE mg/dL
Nitrite: NEGATIVE
PROTEIN: NEGATIVE mg/dL
Specific Gravity, Urine: 1.01 (ref 1.005–1.030)
pH: 5 (ref 5.0–8.0)

## 2016-12-08 LAB — CBG MONITORING, ED
Glucose-Capillary: 194 mg/dL — ABNORMAL HIGH (ref 65–99)
Glucose-Capillary: 184 mg/dL — ABNORMAL HIGH (ref 65–99)

## 2016-12-08 LAB — CBC
HEMATOCRIT: 38.2 % (ref 36.0–46.0)
Hemoglobin: 13.1 g/dL (ref 12.0–15.0)
MCH: 29.6 pg (ref 26.0–34.0)
MCHC: 34.3 g/dL (ref 30.0–36.0)
MCV: 86.4 fL (ref 78.0–100.0)
Platelets: 197 10*3/uL (ref 150–400)
RBC: 4.42 MIL/uL (ref 3.87–5.11)
RDW: 13.1 % (ref 11.5–15.5)
WBC: 11.4 10*3/uL — ABNORMAL HIGH (ref 4.0–10.5)

## 2016-12-08 LAB — BASIC METABOLIC PANEL
Anion gap: 14 (ref 5–15)
BUN: 15 mg/dL (ref 6–20)
CHLORIDE: 104 mmol/L (ref 101–111)
CO2: 23 mmol/L (ref 22–32)
Calcium: 9.6 mg/dL (ref 8.9–10.3)
Creatinine, Ser: 1.5 mg/dL — ABNORMAL HIGH (ref 0.44–1.00)
GFR calc Af Amer: 43 mL/min — ABNORMAL LOW (ref >60)
GFR calc non Af Amer: 37 mL/min — ABNORMAL LOW (ref >60)
GLUCOSE: 220 mg/dL — AB (ref 65–99)
POTASSIUM: 4.3 mmol/L (ref 3.5–5.1)
Sodium: 141 mmol/L (ref 135–145)

## 2016-12-08 LAB — I-STAT CG4 LACTIC ACID, ED
LACTIC ACID, VENOUS: 2.09 mmol/L — AB (ref 0.5–1.9)
Lactic Acid, Venous: 1.59 mmol/L (ref 0.5–1.9)

## 2016-12-08 LAB — I-STAT TROPONIN, ED
Troponin i, poc: 0 ng/mL (ref 0.00–0.08)
Troponin i, poc: 0 ng/mL (ref 0.00–0.08)

## 2016-12-08 LAB — D-DIMER, QUANTITATIVE (NOT AT ARMC): D DIMER QUANT: 0.57 ug{FEU}/mL — AB (ref 0.00–0.50)

## 2016-12-08 MED ORDER — IOPAMIDOL (ISOVUE-370) INJECTION 76%
INTRAVENOUS | Status: AC
  Administered 2016-12-08: 80 mL
  Filled 2016-12-08: qty 100

## 2016-12-08 MED ORDER — SODIUM CHLORIDE 0.9 % IV BOLUS (SEPSIS)
1000.0000 mL | Freq: Once | INTRAVENOUS | Status: AC
  Administered 2016-12-08: 1000 mL via INTRAVENOUS

## 2016-12-08 NOTE — ED Provider Notes (Signed)
[facility] DEPT Provider Note   CSN: [id number] Arrival date & time: 12/08/16  1536     History   Chief Complaint Chief Complaint  Patient presents with  . Loss of Consciousness    HPI Lisa Hanson is a 56 y.o. female.  HPI   Patient is a 56 year old female with history of diabetes and hyperlipidemia presenting today with presyncope. Patient was working at her job, a cigarette stands. When she all of a sudden she felt warmth and feels like she is given a pass out. A coworker came and helped her sit down. She started to feel little bit better. They gave her a Coke and had her drink it. Then EMS arrived and her sugar was 182. She says that she ate a little less than usual for lunch today because she wasn't feeling very well. She only had some peanut butter crackers and took her normal amount of insulin.  Durign this episode she had no headache, shortness breath, chest pain, abdominal pain, or other symptoms.  Past Medical History:  Diagnosis Date  . Diabetes mellitus     Patient Active Problem List   Diagnosis Date Noted  . Strain of peroneal tendon 06/28/2012  . Diabetes mellitus 03/10/2011    Past Surgical History:  Procedure Laterality Date  . ABDOMINAL HYSTERECTOMY    . ankle Left    reconstructive surgery  . FOOT OSTEOTOMY W/ PLANTAR FASCIA RELEASE      OB History    No data available       Home Medications    Prior to Admission medications   Medication Sig Start Date End Date Taking? Authorizing Provider  Insulin Aspart Prot & Aspart (NOVOLOG MIX 70/30 [place]) Inject 25-55 Units into the skin 2 (two) times daily. Takes 55 units in am and 25 units in pm   Yes Historical Provider, MD  lisinopril (PRINIVIL,ZESTRIL) 5 MG tablet Take 5 mg by mouth daily. 09/23/16  Yes Historical Provider, MD  metFORMIN (GLUCOPHAGE) 500 MG tablet Take 500 mg by mouth 2 (two) times daily with a meal.   Yes Historical Provider, MD  rosuvastatin (CRESTOR) 40 MG tablet Take 40 mg  by mouth daily. 09/25/16  Yes Historical Provider, MD    Family History Family History  Problem Relation Age of Onset  . Diabetes Mother   . Heart failure Mother   . Diabetes Father   . Colon cancer Neg Hx     Social History Social History  Substance Use Topics  . Smoking status: Never Smoker  . Smokeless tobacco: Never Used  . Alcohol use No     Allergies   Latex   Review of Systems Review of Systems  Constitutional: Negative for activity change, fatigue and fever.  HENT: Negative for congestion.   Respiratory: Negative for shortness of breath.   Cardiovascular: Negative for chest pain and palpitations.  Gastrointestinal: Negative for abdominal pain.  Musculoskeletal: Negative for back pain.  Neurological: Positive for weakness and light-headedness. Negative for speech difficulty.  Psychiatric/Behavioral: Negative for agitation.  All other systems reviewed and are negative.    Physical Exam Updated Vital Signs BP 115/60   Pulse 82   Temp 98.3 F (36.8 C) (Oral)   Resp 17   Ht 5\' 7"  (1.702 m)   Wt 190 lb (86.2 kg)   SpO2 95%   BMI 29.76 kg/m   Physical Exam  Constitutional: She is oriented to person, place, and time. She appears well-developed and well-nourished.  HENT:  Head:  Normocephalic and atraumatic.  Right Ear: External ear normal.  Left Ear: External ear normal.  Bilateral TMs normal.  Eyes: Right eye exhibits no discharge. Left eye exhibits no discharge.  Cardiovascular: Normal rate and regular rhythm.   No murmur heard. Pulmonary/Chest: Effort normal and breath sounds normal. No respiratory distress. She has no wheezes.  Abdominal: Soft. She exhibits no distension.  Musculoskeletal: She exhibits no edema.  Neurological: She is oriented to person, place, and time. No cranial nerve deficit.  Skin: Skin is warm and dry. She is not diaphoretic.  Psychiatric: She has a normal mood and affect.  Nursing note and vitals reviewed.    ED  Treatments / Results  Labs (all labs ordered are listed, but only abnormal results are displayed) Labs Reviewed  BASIC METABOLIC PANEL - Abnormal; Notable for the following:       Result Value   Glucose, Bld 220 (*)    Creatinine, Ser 1.50 (*)    GFR calc non Af Amer 37 (*)    GFR calc Af Amer 43 (*)    All other components within normal limits  CBC - Abnormal; Notable for the following:    WBC 11.4 (*)    All other components within normal limits  URINALYSIS, ROUTINE W REFLEX MICROSCOPIC - Abnormal; Notable for the following:    APPearance HAZY (*)    Glucose, UA 50 (*)    Hgb urine dipstick SMALL (*)    Leukocytes, UA LARGE (*)    Bacteria, UA RARE (*)    Squamous Epithelial / LPF 0-5 (*)    All other components within normal limits  D-DIMER, QUANTITATIVE (NOT AT [facility]) - Abnormal; Notable for the following:    D-Dimer, Quant 0.57 (*)    All other components within normal limits  CBG MONITORING, ED - Abnormal; Notable for the following:    Glucose-Capillary 194 (*)    All other components within normal limits  I-STAT CG4 LACTIC ACID, ED - Abnormal; Notable for the following:    Lactic Acid, Venous 2.09 (*)    All other components within normal limits  CBG MONITORING, ED - Abnormal; Notable for the following:    Glucose-Capillary 184 (*)    All other components within normal limits  I-STAT TROPOININ, ED  I-STAT CG4 LACTIC ACID, ED  I-STAT TROPOININ, ED    EKG  EKG Interpretation  Date/Time:  Wednesday December 08 2016 15:45:38 EDT Ventricular Rate:  85 PR Interval:    QRS Duration: 60 QT Interval:  460 QTC Calculation: 548 R Axis:   36 Text Interpretation:  Sinus rhythm Borderline T abnormalities, lateral leads Prolonged QT interval Normal sinus rhythm Confirmed by Gerald Leitz ([id number]) on 12/08/2016 3:48:48 PM       Radiology Dg Chest 2 View  Result Date: 12/08/2016 CLINICAL DATA:  Syncopal episode at work today with out associated shortness of breath or  chest pain. History of diabetes. EXAM: CHEST  2 VIEW COMPARISON:  None in PACs FINDINGS: The lungs are well-expanded and clear. The heart and pulmonary vascularity are normal. The mediastinum is normal in width. There is no pleural effusion. The bony thorax exhibits no acute abnormality. IMPRESSION: There is no active cardiopulmonary disease. Electronically Signed   By: David  Martinique M.D.   On: 12/08/2016 16:46   Ct Head Wo Contrast  Result Date: 12/08/2016 CLINICAL DATA:  Syncopal episode today. EXAM: CT HEAD WITHOUT CONTRAST TECHNIQUE: Contiguous axial images were obtained from the base of the skull through the  vertex without intravenous contrast. COMPARISON:  None. FINDINGS: Brain: No evidence of acute infarction, hemorrhage, hydrocephalus, extra-axial collection or mass lesion/mass effect. Vascular: No hyperdense vessel or unexpected calcification. Skull: Normal. Negative for fracture or focal lesion. Sinuses/Orbits: No acute finding. Other: None. IMPRESSION: Negative noncontrast head CT. Electronically Signed   By: Earle Gell M.D.   On: 12/08/2016 19:34   Ct Angio Chest Pe W And/or Wo Contrast  Result Date: 12/08/2016 CLINICAL DATA:  Elevated D-dimer.  Syncope and shortness of breath. EXAM: CT ANGIOGRAPHY CHEST WITH CONTRAST TECHNIQUE: Multidetector CT imaging of the chest was performed using the standard protocol during bolus administration of intravenous contrast. Multiplanar CT image reconstructions and MIPs were obtained to evaluate the vascular anatomy. CONTRAST:  80 mL Isovue 370 IV COMPARISON:  Chest radiograph 12/08/2016 FINDINGS: Cardiovascular: Contrast injection is sufficient to demonstrate satisfactory opacification of the pulmonary arteries to the segmental level. There is no pulmonary embolus. The main pulmonary artery is within normal limits for size. There is no CT evidence of acute right heart strain. The visualized aorta is normal. There is a normal variant aortic arch branching  pattern with the brachiocephalic and left common carotid arteries sharing a common origin. Heart size is normal, without pericardial effusion. Mediastinum/Nodes: No mediastinal, hilar or axillary lymphadenopathy. The visualized thyroid and thoracic esophageal course are unremarkable. Lungs/Pleura: There is a 4 mm perifissural nodule along the right minor fissure. No pleural effusion or pneumothorax. No focal airspace consolidation. No focal pleural abnormality. There is bilateral dependent subsegmental atelectasis. Upper Abdomen: Contrast bolus timing is not optimized for evaluation of the abdominal organs. Within this limitation, the visualized organs of the upper abdomen are normal. Musculoskeletal: There is a sclerotic focus within the anterior aspect of the inferior endplate of T8. Bones are otherwise unremarkable. Review of the MIP images confirms the above findings. IMPRESSION: 1. No pulmonary embolus or acute aortic syndrome. 2. Sclerotic focus at the anterior aspect of the T8 inferior endplate. This may be a benign lesions such as a bone island; however, if the patient has a history of malignancy, especially breast cancer, sclerotic metastatic disease would also be a consideration. No other osseous lesions. Electronically Signed   By: Ulyses Jarred M.D.   On: 12/08/2016 22:04    Procedures Procedures (including critical care time)  Medications Ordered in ED Medications  sodium chloride 0.9 % bolus 1,000 mL (0 mLs Intravenous Stopped 12/08/16 1838)  iopamidol (ISOVUE-370) 76 % injection (80 mLs  Contrast Given 12/08/16 2125)     Initial Impression / Assessment and Plan / ED Course  I have reviewed the triage vital signs and the nursing notes.  Pertinent labs & imaging results that were available during my care of the patient were reviewed by me and considered in my medical decision making (see chart for details).    She is well-appearing 56 year old female presenting with presyncope. Patient  has a history of diabetes. It is possible the patient had let her blood sugar get too low, she did in fact take her normal insulin and ate less than usual. Her sugar was only checked after ingestion of a Coke. Other causes of presyncope could be dehydration, vasovagal, infection.  We will chest labs, EKG, look for signs of infection that would cause her to feel unwell the first place.    Patient currently has normal vital signs and physical exam.  Upon trying to discharge patient, she was walked to the bathroom and felt presyncopal and unwell according to nurse.  Patient had normal vital signs when back in the room. We'll do CT head, and d-dimer.  D-dimer positive. Did CT angio. Results of CT discussed with patient.   Patient was ambulated again and felt perfectly fine. Is eating and drinking normally. At this point patient's had normal vital signs, been observed for 4 hours in the emergency department, normal labs, normal CT head and CT angio. We'll have her follow up with primary care physician.  Patient is comfortable, ambulatory, and taking PO at time of discharge.  Patient expressed understanding about return precautions. ,  Final Clinical Impressions(s) / ED Diagnoses   Final diagnoses:  Near syncope    New Prescriptions Discharge Medication List as of 12/08/2016 10:16 PM       Naarah Borgerding Julio Alm, MD 12/08/16 2335

## 2016-12-08 NOTE — ED Notes (Signed)
Pt returns from xray

## 2016-12-08 NOTE — Discharge Instructions (Addendum)
We are unsure what caused her symptoms today. However could've been a low blood sugar. Be sure tobe very careful to eat the amount that you need to after taking insulin. Please rest, hydrate, and return with any concerns.  On CT you have a lesion on your spine, which is likely benign.  Here is the read:  T8 inferior endplate. This may be a benign lesions such as a bone island; however, if the patient has a history of malignancy, especially breast cancer, sclerotic metastatic disease would also be a consideration. No other osseous lesions.

## 2016-12-08 NOTE — ED Triage Notes (Signed)
BIB EMS from work, pt had syncopal episode lasting approx 5 min, pt now alert but confused and slow to respond. Initially hypotensive on scene. Given 500 NS. CBG 188.

## 2016-12-08 NOTE — ED Notes (Addendum)
Pt ambulated to br. Walks with steady gait but on arrival becomes weak and sags toward.sink. Pt seated on toilet and transported back to room via wc. On return bp 103/50. Dr. Ellie Lunch made aware. Tremor noted at right arm.

## 2016-12-15 DIAGNOSIS — E559 Vitamin D deficiency, unspecified: Secondary | ICD-10-CM | POA: Diagnosis not present

## 2016-12-15 DIAGNOSIS — I10 Essential (primary) hypertension: Secondary | ICD-10-CM | POA: Diagnosis not present

## 2016-12-15 DIAGNOSIS — E1165 Type 2 diabetes mellitus with hyperglycemia: Secondary | ICD-10-CM | POA: Diagnosis not present

## 2016-12-15 DIAGNOSIS — E7800 Pure hypercholesterolemia, unspecified: Secondary | ICD-10-CM | POA: Diagnosis not present

## 2017-03-18 DIAGNOSIS — H2513 Age-related nuclear cataract, bilateral: Secondary | ICD-10-CM | POA: Diagnosis not present

## 2017-03-18 DIAGNOSIS — E113513 Type 2 diabetes mellitus with proliferative diabetic retinopathy with macular edema, bilateral: Secondary | ICD-10-CM | POA: Diagnosis not present

## 2017-03-18 DIAGNOSIS — H43823 Vitreomacular adhesion, bilateral: Secondary | ICD-10-CM | POA: Diagnosis not present

## 2017-03-25 DIAGNOSIS — H04123 Dry eye syndrome of bilateral lacrimal glands: Secondary | ICD-10-CM | POA: Diagnosis not present

## 2017-03-25 DIAGNOSIS — H40033 Anatomical narrow angle, bilateral: Secondary | ICD-10-CM | POA: Diagnosis not present

## 2017-03-26 ENCOUNTER — Ambulatory Visit (INDEPENDENT_AMBULATORY_CARE_PROVIDER_SITE_OTHER): Payer: 59

## 2017-03-26 ENCOUNTER — Ambulatory Visit (INDEPENDENT_AMBULATORY_CARE_PROVIDER_SITE_OTHER): Payer: 59 | Admitting: Family Medicine

## 2017-03-26 ENCOUNTER — Encounter: Payer: Self-pay | Admitting: Family Medicine

## 2017-03-26 VITALS — BP 132/72 | HR 80 | Temp 98.3°F | Resp 18 | Ht 66.93 in | Wt 197.0 lb

## 2017-03-26 DIAGNOSIS — S39012A Strain of muscle, fascia and tendon of lower back, initial encounter: Secondary | ICD-10-CM | POA: Diagnosis not present

## 2017-03-26 DIAGNOSIS — M545 Low back pain: Secondary | ICD-10-CM

## 2017-03-26 LAB — POCT URINALYSIS DIP (MANUAL ENTRY)
BILIRUBIN UA: NEGATIVE
BILIRUBIN UA: NEGATIVE mg/dL
Glucose, UA: 500 mg/dL — AB
Leukocytes, UA: NEGATIVE
NITRITE UA: NEGATIVE
PH UA: 6 (ref 5.0–8.0)
Protein Ur, POC: NEGATIVE mg/dL
RBC UA: NEGATIVE
Spec Grav, UA: 1.015 (ref 1.010–1.025)
Urobilinogen, UA: 0.2 E.U./dL

## 2017-03-26 MED ORDER — MELOXICAM 7.5 MG PO TABS: 7.5000 mg | ORAL_TABLET | Freq: Every day | ORAL | 0 refills | Status: AC

## 2017-03-26 MED ORDER — METHOCARBAMOL 500 MG PO TABS: 500.0000 mg | ORAL_TABLET | Freq: Four times a day (QID) | ORAL | 1 refills | Status: AC

## 2017-03-26 MED ORDER — TRAMADOL HCL 50 MG PO TABS: 50.0000 mg | ORAL_TABLET | Freq: Four times a day (QID) | ORAL | 0 refills | Status: AC | PRN

## 2017-03-26 NOTE — Patient Instructions (Addendum)
   IF you received an x-ray today, you will receive an invoice from [facility]. Please contact [facility] at [phone] with questions or concerns regarding your invoice.   IF you received labwork today, you will receive an invoice from [facility]. Please contact [facility] at [phone] with questions or concerns regarding your invoice.   Our billing staff will not be able to assist you with questions regarding bills from these companies.  You will be contacted with the lab results as soon as they are available. The fastest way to get your results is to activate your My Chart account. Instructions are located on the last page of this paperwork. If you have not heard from us regarding the results in 2 weeks, please contact this office.     Low Back Strain A strain is a stretch or tear in a muscle or the strong cords of tissue that attach muscle to bone (tendons). Strains of the lower back (lumbar spine) are a common cause of low back pain. A strain occurs when muscles or tendons are torn or are stretched beyond their limits. The muscles may become inflamed, resulting in pain and sudden muscle tightening (spasms). A strain can happen suddenly due to an injury (trauma), or it can develop gradually due to overuse. There are three types of strains:  Grade 1 is a mild strain involving a minor tear of the muscle fibers or tendons. This may cause some pain but no loss of muscle strength.  Grade 2 is a moderate strain involving a partial tear of the muscle fibers or tendons. This causes more severe pain and some loss of muscle strength.  Grade 3 is a severe strain involving a complete tear of the muscle or tendon. This causes severe pain and complete or nearly complete loss of muscle strength.  What are the causes? This condition may be caused by:  Trauma, such as a fall or a hit to the body.  Twisting or overstretching the back. This may result from doing activities that  require a lot of energy, such as lifting heavy objects.  What increases the risk? The following factors may increase your risk of getting this condition:  Playing contact sports.  Participating in sports or activities that put excessive stress on the back and require a lot of bending and twisting, including: ? Lifting weights or heavy objects. ? Gymnastics. ? Soccer. ? Figure skating. ? Snowboarding.  Being overweight or obese.  Having poor strength and flexibility.  What are the signs or symptoms? Symptoms of this condition may include:  Sharp or dull pain in the lower back that does not go away. Pain may extend to the buttocks.  Stiffness.  Limited range of motion.  Inability to stand up straight due to stiffness or pain.  Muscle spasms.  How is this diagnosed? This condition may be diagnosed based on:  Your symptoms.  Your medical history.  A physical exam. ? Your health care provider may push on certain areas of your back to determine the source of your pain. ? You may be asked to bend forward, backward, and side to side to assess the severity of your pain and your range of motion.  Imaging tests, such as: ? X-rays. ? MRI.  How is this treated? Treatment for this condition may include:  Applying heat and cold to the affected area.  Medicines to help relieve pain and to relax your muscles (muscle relaxants).  NSAIDs to help reduce swelling and discomfort.    Physical therapy.  When your symptoms improve, it is important to gradually return to your normal routine as soon as possible to reduce pain, avoid stiffness, and avoid loss of muscle strength. Generally, symptoms should improve within 6 weeks of treatment. However, recovery time varies. Follow these instructions at home: Managing pain, stiffness, and swelling  If directed, apply ice to the injured area during the first 24 hours after your injury. ? Put ice in a plastic bag. ? Place a towel between  your skin and the bag. ? Leave the ice on for 20 minutes, 2-3 times a day.  If directed, apply heat to the affected area as often as told by your health care provider. Use the heat source that your health care provider recommends, such as a moist heat pack or a heating pad. ? Place a towel between your skin and the heat source. ? Leave the heat on for 20-30 minutes. ? Remove the heat if your skin turns bright red. This is especially important if you are unable to feel pain, heat, or cold. You may have a greater risk of getting burned. Activity  Rest and return to your normal activities as told by your health care provider. Ask your health care provider what activities are safe for you.  Avoid activities that take a lot of effort (are strenuous) for as long as told by your health care provider.  Do exercises as told by your health care provider. General instructions   Take over-the-counter and prescription medicines only as told by your health care provider.  If you have questions or concerns about safety while taking pain medicine, talk with your health care provider.  Do not drive or operate heavy machinery until you know how your pain medicine affects you.  Do not use any tobacco products, such as cigarettes, chewing tobacco, and e-cigarettes. Tobacco can delay bone healing. If you need help quitting, ask your health care provider.  Keep all follow-up visits as told by your health care provider. This is important. How is this prevented?  Warm up and stretch before being active.  Cool down and stretch after being active.  Give your body time to rest between periods of activity.  Avoid: ? Being physically inactive for long periods at a time. ? Exercising or playing sports when you are tired or in pain.  Use correct form when playing sports and lifting heavy objects.  Use good posture when sitting and standing.  Maintain a healthy weight.  Sleep on a mattress with medium  firmness to support your back.  Make sure to use equipment that fits you, including shoes that fit well.  Be safe and responsible while being active to avoid falls.  Do at least 150 minutes of moderate-intensity exercise each week, such as brisk walking or water aerobics. Try a form of exercise that takes stress off your back, such as swimming or stationary cycling.  Maintain physical fitness, including: ? Strength. ? Flexibility. ? Cardiovascular fitness. ? Endurance. Contact a health care provider if:  Your back pain does not improve after 6 weeks of treatment.  Your symptoms get worse. Get help right away if:  Your back pain is severe.  You are unable to stand or walk.  You develop pain in your legs.  You develop weakness in your buttocks or legs.  You have difficulty controlling when you urinate or when you have a bowel movement. This information is not intended to replace advice given to you by your health   care provider. Make sure you discuss any questions you have with your health care provider. Document Released: 08/16/2005 Document Revised: 04/22/2016 Document Reviewed: 05/28/2015 Elsevier Interactive Patient Education  2017 [employer]  Low Back Strain Rehab Ask your health care provider which exercises are safe for you. Do exercises exactly as told by your health care provider and adjust them as directed. It is normal to feel mild stretching, pulling, tightness, or discomfort as you do these exercises, but you should stop right away if you feel sudden pain or your pain gets worse. Do not begin these exercises until told by your health care provider. Stretching and range of motion exercises These exercises warm up your muscles and joints and improve the movement and flexibility of your back. These exercises also help to relieve pain, numbness, and tingling. Exercise A: Single knee to chest  1. Lie on your back on a firm surface with both legs straight. 2. Bend one  of your knees. Use your hands to move your knee up toward your chest until you feel a gentle stretch in your lower back and buttock. ? Hold your leg in this position by holding onto the front of your knee. ? Keep your other leg as straight as possible. 3. Hold for __________ seconds. 4. Slowly return to the starting position. 5. Repeat with your other leg. Repeat __________ times. Complete this exercise __________ times a day. Exercise B: Prone extension on elbows  1. Lie on your abdomen on a firm surface. 2. Prop yourself up on your elbows. 3. Use your arms to help lift your chest up until you feel a gentle stretch in your abdomen and your lower back. ? This will place some of your body weight on your elbows. If this is uncomfortable, try stacking pillows under your chest. ? Your hips should stay down, against the surface that you are lying on. Keep your hip and back muscles relaxed. 4. Hold for __________ seconds. 5. Slowly relax your upper body and return to the starting position. Repeat __________ times. Complete this exercise __________ times a day. Strengthening exercises These exercises build strength and endurance in your back. Endurance is the ability to use your muscles for a long time, even after they get tired. Exercise C: Pelvic tilt 1. Lie on your back on a firm surface. Bend your knees and keep your feet flat. 2. Tense your abdominal muscles. Tip your pelvis up toward the ceiling and flatten your lower back into the floor. ? To help with this exercise, you may place a small towel under your lower back and try to push your back into the towel. 3. Hold for __________ seconds. 4. Let your muscles relax completely before you repeat this exercise. Repeat __________ times. Complete this exercise __________ times a day. Exercise D: Alternating arm and leg raises  1. Get on your hands and knees on a firm surface. If you are on a hard floor, you may want to use padding to cushion  your knees, such as an exercise mat. 2. Line up your arms and legs. Your hands should be below your shoulders, and your knees should be below your hips. 3. Lift your left leg behind you. At the same time, raise your right arm and straighten it in front of you. ? Do not lift your leg higher than your hip. ? Do not lift your arm higher than your shoulder. ? Keep your abdominal and back muscles tight. ? Keep your hips facing the ground. ?   Do not arch your back. ? Keep your balance carefully, and do not hold your breath. 4. Hold for __________ seconds. 5. Slowly return to the starting position and repeat with your right leg and your left arm. Repeat __________ times. Complete this exercise __________times a day. Exercise J: Single leg lower with bent knees 1. Lie on your back on a firm surface. 2. Tense your abdominal muscles and lift your feet off the floor, one foot at a time, so your knees and hips are bent in an "L" shape (at about 90 degrees). ? Your knees should be over your hips and your lower legs should be parallel to the floor. 3. Keeping your abdominal muscles tense and your knee bent, slowly lower one of your legs so your toe touches the ground. 4. Lift your leg back up to return to the starting position. ? Do not hold your breath. ? Do not let your back arch. Keep your back flat against the ground. 5. Repeat with your other leg. Repeat __________ times. Complete this exercise __________ times a day. Posture and body mechanics  Body mechanics refers to the movements and positions of your body while you do your daily activities. Posture is part of body mechanics. Good posture and healthy body mechanics can help to relieve stress in your body's tissues and joints. Good posture means that your spine is in its natural S-curve position (your spine is neutral), your shoulders are pulled back slightly, and your head is not tipped forward. The following are general guidelines for applying  improved posture and body mechanics to your everyday activities. Standing   When standing, keep your spine neutral and your feet about hip-width apart. Keep a slight bend in your knees. Your ears, shoulders, and hips should line up.  When you do a task in which you stand in one place for a long time, place one foot up on a stable object that is 2-4 inches (5-10 cm) high, such as a footstool. This helps keep your spine neutral. Sitting   When sitting, keep your spine neutral and keep your feet flat on the floor. Use a footrest, if necessary, and keep your thighs parallel to the floor. Avoid rounding your shoulders, and avoid tilting your head forward.  When working at a desk or a computer, keep your desk at a height where your hands are slightly lower than your elbows. Slide your chair under your desk so you are close enough to maintain good posture.  When working at a computer, place your monitor at a height where you are looking straight ahead and you do not have to tilt your head forward or downward to look at the screen. Resting   When lying down and resting, avoid positions that are most painful for you.  If you have pain with activities such as sitting, bending, stooping, or squatting (flexion-based activities), lie in a position in which your body does not bend very much. For example, avoid curling up on your side with your arms and knees near your chest (fetal position).  If you have pain with activities such as standing for a long time or reaching with your arms (extension-based activities), lie with your spine in a neutral position and bend your knees slightly. Try the following positions: ? Lying on your side with a pillow between your knees. ? Lying on your back with a pillow under your knees. Lifting   When lifting objects, keep your feet at least shoulder-width apart and tighten your   abdominal muscles.  Bend your knees and hips and keep your spine neutral. It is important  to lift using the strength of your legs, not your back. Do not lock your knees straight out.  Always ask for help to lift heavy or awkward objects. This information is not intended to replace advice given to you by your health care provider. Make sure you discuss any questions you have with your health care provider. Document Released: 08/16/2005 Document Revised: 04/22/2016 Document Reviewed: 05/28/2015 Elsevier Interactive Patient Education  2018 [employer] Inc[employer]   

## 2017-03-26 NOTE — Progress Notes (Signed)
Subjective:  By signing my name below, I, Moises Blood, attest that this documentation has been prepared under the direction and in the presence of Delman Cheadle, MD. Electronically Signed: Moises Blood, [place]. 03/26/2017 , 2:11 PM .  Patient was seen in Room 11 .   Patient ID: Lisa Hanson, female    DOB: 1961-06-05, 56 y.o.   MRN: [id number] Chief Complaint  Patient presents with  . Back Pain    x 1 week pt states she was moving a table and thinks she may have pulled something.    HPI Lisa Hanson is a 56 y.o. female who presents to Primary Care at [facility] complaining of gradual onset back pain when picking up a table a week ago. Patient reports lifting up a dining room table with her granddaughter 8 days ago and felt a pop. She felt the pain more present 2 days later, and more on the right. She's been trying to treat it herself with heating pad and icing the area, as well as using a tens unit for it without much relief. She's also been taking ibuprofen and alternating with aleve; denies abdominal pain. She describes pain worsens when changing position from seated to standing. She denies weakness or numbness. She has been constipated recently but she had Mac & Cheese recently. For a few days, her pain did improve, but whenever she tweaks the area, the pain will flare back up. She's been standing, bending and twisting at her job.   Past Medical History:  Diagnosis Date  . Diabetes mellitus    Prior to Admission medications   Medication Sig Start Date End Date Taking? Authorizing Provider  Insulin Aspart Prot & Aspart (NOVOLOG MIX 70/30 [place]) Inject 25-55 Units into the skin 2 (two) times daily. Takes 55 units in am and 25 units in pm    [provider]  lisinopril (PRINIVIL,ZESTRIL) 5 MG tablet Take 5 mg by mouth daily. 09/23/16   [provider]  metFORMIN (GLUCOPHAGE) 500 MG tablet Take 500 mg by mouth 2 (two) times daily with a meal.    [provider]    rosuvastatin (CRESTOR) 40 MG tablet Take 40 mg by mouth daily. 09/25/16   [provider]   Allergies  Allergen Reactions  . Latex Itching and Rash  \ Past Surgical History:  Procedure Laterality Date  . ABDOMINAL HYSTERECTOMY    . ankle Left    reconstructive surgery  . FOOT OSTEOTOMY W/ PLANTAR FASCIA RELEASE     Family History  Problem Relation Age of Onset  . Diabetes Mother   . Heart failure Mother   . Diabetes Father   . Colon cancer Neg Hx    Social History   Social History  . Marital status: Single    Spouse name: N/A  . Number of children: N/A  . Years of education: N/A   Social History Main Topics  . Smoking status: Never Smoker  . Smokeless tobacco: Never Used  . Alcohol use No  . Drug use: No  . Sexual activity: No   Other Topics Concern  . None   Social History Narrative  . None   Depression screen [facility] 2/9 08/09/2016 10/15/2014  Decreased Interest 0 0  Down, Depressed, Hopeless 0 0  PHQ - 2 Score 0 0    Review of Systems  Constitutional: Negative for chills, fatigue, fever and unexpected weight change.  Respiratory: Negative for cough.   Gastrointestinal: Positive for constipation. Negative for diarrhea,  nausea and vomiting.  Musculoskeletal: Positive for back pain.  Skin: Negative for rash and wound.  Neurological: Negative for dizziness, weakness and headaches.       Objective:   Physical Exam  Constitutional: She is oriented to person, place, and time. She appears well-developed and well-nourished. No distress.  HENT:  Head: Normocephalic and atraumatic.  Eyes: Pupils are equal, round, and reactive to light. EOM are normal.  Neck: Neck supple.  Cardiovascular: Normal rate.   Pulmonary/Chest: Effort normal. No respiratory distress.  Musculoskeletal: Normal range of motion.  Palpated spasms over the lower lumbar right paraspinal, mild spasms on left; 5/5 strength in lower extremity, pain with right hip flexor  Neurological:  She is alert and oriented to person, place, and time.  Reflex Scores:      Patellar reflexes are 2+ on the right side and 2+ on the left side.      Achilles reflexes are 2+ on the right side and 2+ on the left side. Skin: Skin is warm and dry.  Psychiatric: She has a normal mood and affect. Her behavior is normal.  Nursing note and vitals reviewed.   BP 132/72   Pulse 80   Temp 98.3 F (36.8 C) (Oral)   Resp 18   Ht 5' 6.93" (1.7 m)   Wt 197 lb (89.4 kg)   SpO2 98%   BMI 30.92 kg/m    Dg Lumbar Spine Complete  Result Date: 03/26/2017 CLINICAL DATA:  Low back pain.  Heavy lifting 1 week ago. EXAM: LUMBAR SPINE - COMPLETE 4+ VIEW COMPARISON:  CT 09/06/2014 FINDINGS: There is no evidence of lumbar spine fracture. Alignment is normal. Intervertebral disc spaces are maintained. IMPRESSION: Negative. Electronically Signed   By: Rolm Baptise M.D.   On: 03/26/2017 15:00       Assessment & Plan:   1. Low back pain, unspecified back pain laterality, unspecified chronicity, with sciatica presence unspecified   2. Strain of lumbar paraspinal muscle, initial encounter     Orders Placed This Encounter  Procedures  . DG Lumbar Spine Complete    Standing Status:   Future    Number of Occurrences:   1    Standing Expiration Date:   03/26/2018    Order Specific Question:   Reason for Exam (SYMPTOM  OR DIAGNOSIS REQUIRED)    Answer:   tenderness to palp over low lumbar spine and right paraspinal wiht decreased ROM since pop during heavy lifting 1 wk prior    Order Specific Question:   Is the patient pregnant?    Answer:   No    Order Specific Question:   Preferred imaging location?    Answer:   External  . POCT urinalysis dipstick    Meds ordered this encounter  Medications  . meloxicam (MOBIC) 7.5 MG tablet    Sig: Take 1 tablet (7.5 mg total) by mouth daily.    Dispense:  30 tablet    Refill:  0  . methocarbamol (ROBAXIN) 500 MG tablet    Sig: Take 1-2 tablets (500-1,000 mg total)  by mouth 4 (four) times daily.    Dispense:  60 tablet    Refill:  1  . traMADol (ULTRAM) 50 MG tablet    Sig: Take 1 tablet (50 mg total) by mouth every 6 (six) hours as needed.    Dispense:  30 tablet    Refill:  0    I personally performed the services described in this documentation, which was scribed  in my presence. The recorded information has been reviewed and considered, and addended by me as needed.   Delman Cheadle, M.D.  Primary Care at [facility] ([place]) [address] [place], [place] [address] [phone] phone [phone] fax  03/28/17 10:19 PM

## 2017-04-23 ENCOUNTER — Other Ambulatory Visit: Payer: Self-pay | Admitting: Family Medicine

## 2017-04-24 ENCOUNTER — Encounter: Payer: Self-pay | Admitting: Family Medicine

## 2017-04-24 DIAGNOSIS — N183 Chronic kidney disease, stage 3 unspecified: Secondary | ICD-10-CM | POA: Insufficient documentation

## 2017-06-28 DIAGNOSIS — E7800 Pure hypercholesterolemia, unspecified: Secondary | ICD-10-CM | POA: Diagnosis not present

## 2017-06-28 DIAGNOSIS — E1165 Type 2 diabetes mellitus with hyperglycemia: Secondary | ICD-10-CM | POA: Diagnosis not present

## 2017-06-28 DIAGNOSIS — E559 Vitamin D deficiency, unspecified: Secondary | ICD-10-CM | POA: Diagnosis not present

## 2017-07-01 DIAGNOSIS — I10 Essential (primary) hypertension: Secondary | ICD-10-CM | POA: Diagnosis not present

## 2017-07-01 DIAGNOSIS — E7800 Pure hypercholesterolemia, unspecified: Secondary | ICD-10-CM | POA: Diagnosis not present

## 2017-07-01 DIAGNOSIS — K5900 Constipation, unspecified: Secondary | ICD-10-CM | POA: Diagnosis not present

## 2017-07-01 DIAGNOSIS — E1165 Type 2 diabetes mellitus with hyperglycemia: Secondary | ICD-10-CM | POA: Diagnosis not present

## 2017-09-02 ENCOUNTER — Encounter: Payer: Self-pay | Admitting: Gastroenterology

## 2017-11-11 DIAGNOSIS — E1165 Type 2 diabetes mellitus with hyperglycemia: Secondary | ICD-10-CM | POA: Diagnosis not present

## 2017-11-11 DIAGNOSIS — I10 Essential (primary) hypertension: Secondary | ICD-10-CM | POA: Diagnosis not present

## 2017-11-11 DIAGNOSIS — E7800 Pure hypercholesterolemia, unspecified: Secondary | ICD-10-CM | POA: Diagnosis not present

## 2017-11-25 DIAGNOSIS — H43823 Vitreomacular adhesion, bilateral: Secondary | ICD-10-CM | POA: Diagnosis not present

## 2017-11-25 DIAGNOSIS — E113512 Type 2 diabetes mellitus with proliferative diabetic retinopathy with macular edema, left eye: Secondary | ICD-10-CM | POA: Diagnosis not present

## 2017-11-25 DIAGNOSIS — E113591 Type 2 diabetes mellitus with proliferative diabetic retinopathy without macular edema, right eye: Secondary | ICD-10-CM | POA: Diagnosis not present

## 2017-12-23 DIAGNOSIS — E113512 Type 2 diabetes mellitus with proliferative diabetic retinopathy with macular edema, left eye: Secondary | ICD-10-CM | POA: Diagnosis not present

## 2017-12-23 DIAGNOSIS — E113591 Type 2 diabetes mellitus with proliferative diabetic retinopathy without macular edema, right eye: Secondary | ICD-10-CM | POA: Diagnosis not present

## 2018-01-06 DIAGNOSIS — H2513 Age-related nuclear cataract, bilateral: Secondary | ICD-10-CM | POA: Diagnosis not present

## 2018-01-06 DIAGNOSIS — H40033 Anatomical narrow angle, bilateral: Secondary | ICD-10-CM | POA: Diagnosis not present

## 2018-01-26 DIAGNOSIS — H2513 Age-related nuclear cataract, bilateral: Secondary | ICD-10-CM | POA: Diagnosis not present

## 2018-01-26 DIAGNOSIS — H25812 Combined forms of age-related cataract, left eye: Secondary | ICD-10-CM | POA: Diagnosis not present

## 2018-01-26 DIAGNOSIS — Z961 Presence of intraocular lens: Secondary | ICD-10-CM | POA: Diagnosis not present

## 2018-01-26 DIAGNOSIS — H2512 Age-related nuclear cataract, left eye: Secondary | ICD-10-CM | POA: Diagnosis not present

## 2018-01-26 DIAGNOSIS — E113512 Type 2 diabetes mellitus with proliferative diabetic retinopathy with macular edema, left eye: Secondary | ICD-10-CM | POA: Diagnosis not present

## 2018-02-03 DIAGNOSIS — H04123 Dry eye syndrome of bilateral lacrimal glands: Secondary | ICD-10-CM | POA: Diagnosis not present

## 2018-02-03 DIAGNOSIS — H40033 Anatomical narrow angle, bilateral: Secondary | ICD-10-CM | POA: Diagnosis not present

## 2018-02-23 DIAGNOSIS — H25811 Combined forms of age-related cataract, right eye: Secondary | ICD-10-CM | POA: Diagnosis not present

## 2018-02-23 DIAGNOSIS — H2511 Age-related nuclear cataract, right eye: Secondary | ICD-10-CM | POA: Diagnosis not present

## 2018-03-10 DIAGNOSIS — M2550 Pain in unspecified joint: Secondary | ICD-10-CM | POA: Diagnosis not present

## 2018-03-10 DIAGNOSIS — E559 Vitamin D deficiency, unspecified: Secondary | ICD-10-CM | POA: Diagnosis not present

## 2018-03-10 DIAGNOSIS — E7800 Pure hypercholesterolemia, unspecified: Secondary | ICD-10-CM | POA: Diagnosis not present

## 2018-03-10 DIAGNOSIS — E1165 Type 2 diabetes mellitus with hyperglycemia: Secondary | ICD-10-CM | POA: Diagnosis not present

## 2018-03-17 DIAGNOSIS — E1165 Type 2 diabetes mellitus with hyperglycemia: Secondary | ICD-10-CM | POA: Diagnosis not present

## 2018-03-17 DIAGNOSIS — I10 Essential (primary) hypertension: Secondary | ICD-10-CM | POA: Diagnosis not present

## 2018-03-17 DIAGNOSIS — E7800 Pure hypercholesterolemia, unspecified: Secondary | ICD-10-CM | POA: Diagnosis not present

## 2018-04-21 DIAGNOSIS — E113513 Type 2 diabetes mellitus with proliferative diabetic retinopathy with macular edema, bilateral: Secondary | ICD-10-CM | POA: Diagnosis not present

## 2018-04-21 DIAGNOSIS — H43823 Vitreomacular adhesion, bilateral: Secondary | ICD-10-CM | POA: Diagnosis not present

## 2018-05-05 DIAGNOSIS — E113513 Type 2 diabetes mellitus with proliferative diabetic retinopathy with macular edema, bilateral: Secondary | ICD-10-CM | POA: Diagnosis not present

## 2018-06-06 DIAGNOSIS — E113513 Type 2 diabetes mellitus with proliferative diabetic retinopathy with macular edema, bilateral: Secondary | ICD-10-CM | POA: Diagnosis not present

## 2018-06-06 DIAGNOSIS — H43823 Vitreomacular adhesion, bilateral: Secondary | ICD-10-CM | POA: Diagnosis not present

## 2018-07-07 DIAGNOSIS — H43823 Vitreomacular adhesion, bilateral: Secondary | ICD-10-CM | POA: Diagnosis not present

## 2018-07-07 DIAGNOSIS — E113513 Type 2 diabetes mellitus with proliferative diabetic retinopathy with macular edema, bilateral: Secondary | ICD-10-CM | POA: Diagnosis not present

## 2018-07-21 DIAGNOSIS — E7800 Pure hypercholesterolemia, unspecified: Secondary | ICD-10-CM | POA: Diagnosis not present

## 2018-07-21 DIAGNOSIS — E559 Vitamin D deficiency, unspecified: Secondary | ICD-10-CM | POA: Diagnosis not present

## 2018-07-21 DIAGNOSIS — E1165 Type 2 diabetes mellitus with hyperglycemia: Secondary | ICD-10-CM | POA: Diagnosis not present

## 2018-07-21 DIAGNOSIS — I10 Essential (primary) hypertension: Secondary | ICD-10-CM | POA: Diagnosis not present

## 2018-08-04 DIAGNOSIS — E1165 Type 2 diabetes mellitus with hyperglycemia: Secondary | ICD-10-CM | POA: Diagnosis not present

## 2018-08-11 DIAGNOSIS — E113513 Type 2 diabetes mellitus with proliferative diabetic retinopathy with macular edema, bilateral: Secondary | ICD-10-CM | POA: Diagnosis not present

## 2018-08-11 DIAGNOSIS — H43823 Vitreomacular adhesion, bilateral: Secondary | ICD-10-CM | POA: Diagnosis not present

## 2018-10-06 DIAGNOSIS — H43823 Vitreomacular adhesion, bilateral: Secondary | ICD-10-CM | POA: Diagnosis not present

## 2018-10-06 DIAGNOSIS — E113513 Type 2 diabetes mellitus with proliferative diabetic retinopathy with macular edema, bilateral: Secondary | ICD-10-CM | POA: Diagnosis not present

## 2018-11-01 DIAGNOSIS — E1165 Type 2 diabetes mellitus with hyperglycemia: Secondary | ICD-10-CM | POA: Diagnosis not present

## 2018-11-22 DIAGNOSIS — E7800 Pure hypercholesterolemia, unspecified: Secondary | ICD-10-CM | POA: Diagnosis not present

## 2018-11-22 DIAGNOSIS — I10 Essential (primary) hypertension: Secondary | ICD-10-CM | POA: Diagnosis not present

## 2018-11-22 DIAGNOSIS — E559 Vitamin D deficiency, unspecified: Secondary | ICD-10-CM | POA: Diagnosis not present

## 2018-12-14 IMAGING — CT CT HEAD W/O CM
3 series · 16 of 47 positions shown, 19 images · non-contrast
Comparison: None.

CLINICAL DATA: Syncopal episode today.

EXAM:
CT HEAD WITHOUT CONTRAST
TECHNIQUE: Contiguous axial images were obtained from the base of the skull
through the vertex without intravenous contrast.

[Series 3: head 5.0 h30s · axial · 0.44mm/px · z∈[-91,+39]mm · 10 of 32 slices shown, 13 images]
[im 3/32  brain]
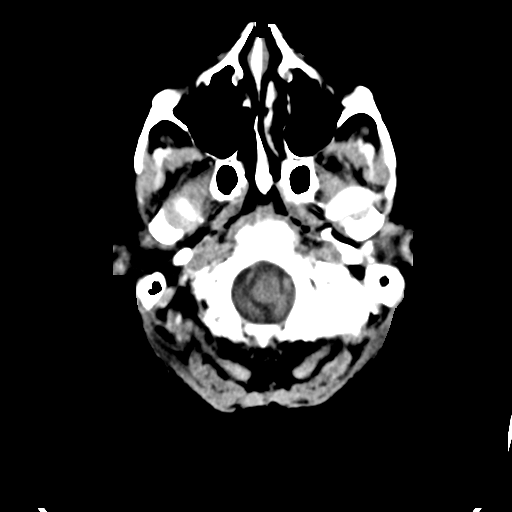
[im 3/32  bone]
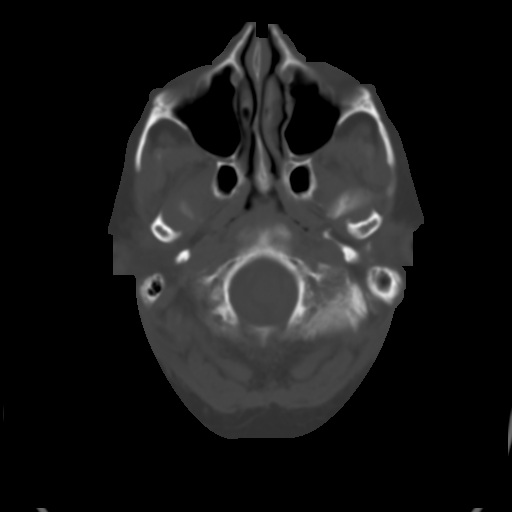
[im 6/32  brain]
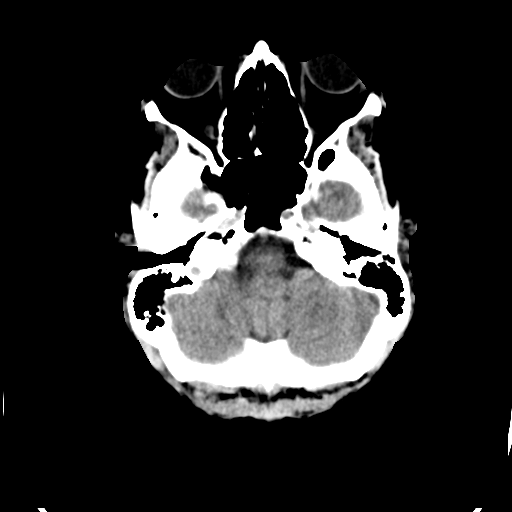
[im 9/32  brain]
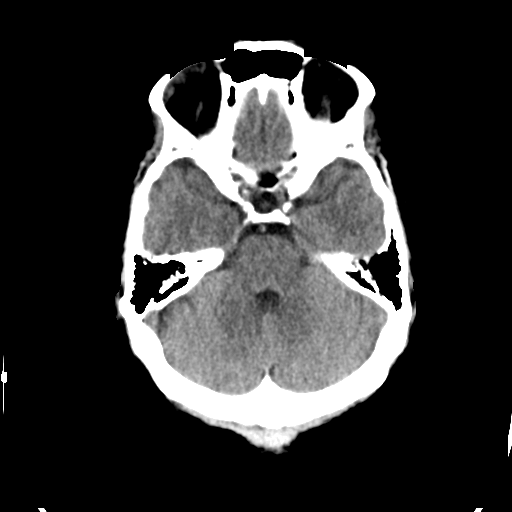
[im 11/32  brain]
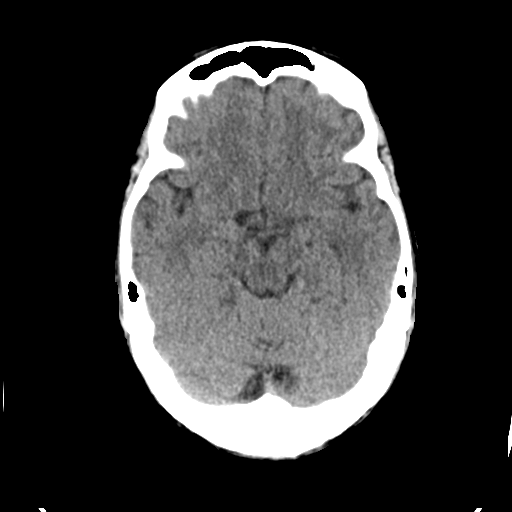
[im 14/32  brain]
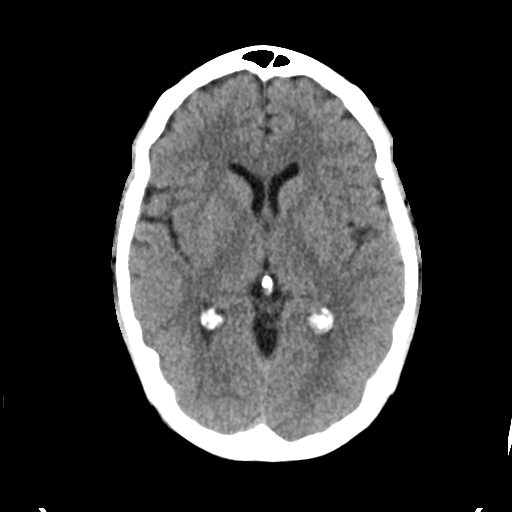
[im 14/32  bone]
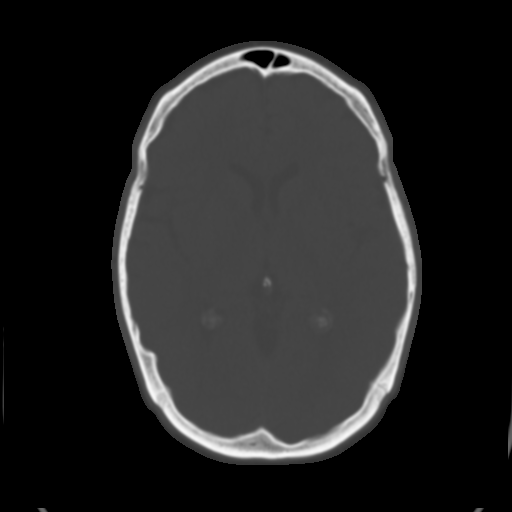
[im 18/32  brain]
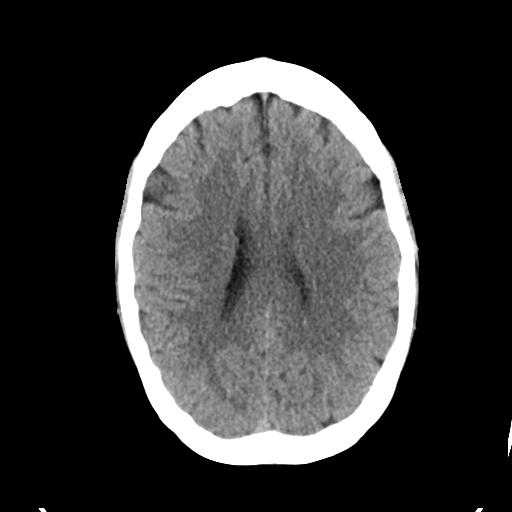
[im 21/32  brain]
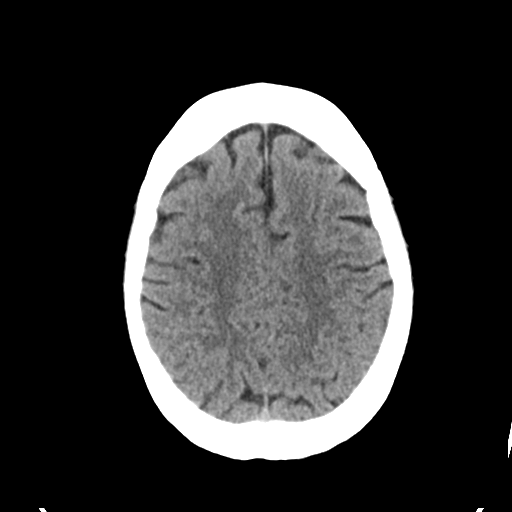
[im 24/32  brain]
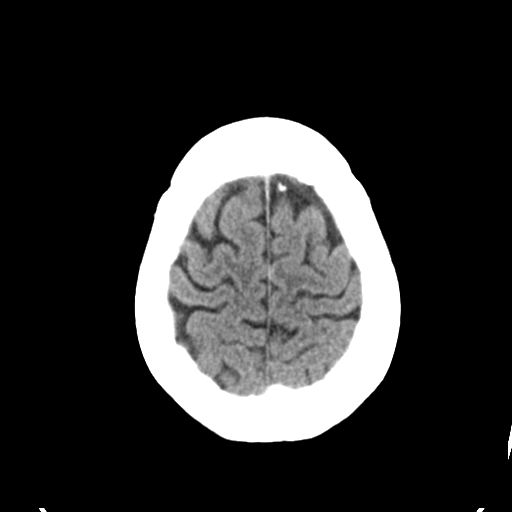
[im 26/32  brain]
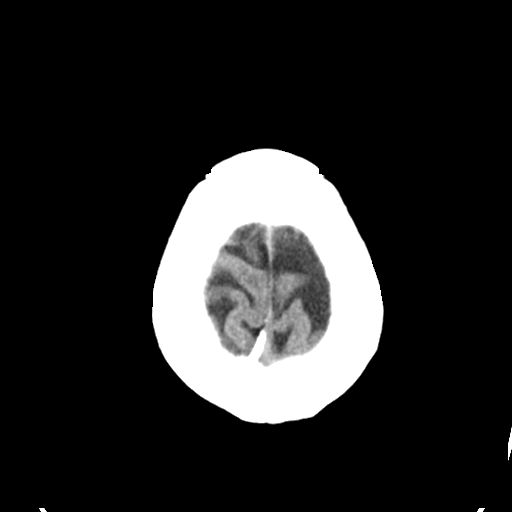
[im 26/32  bone]
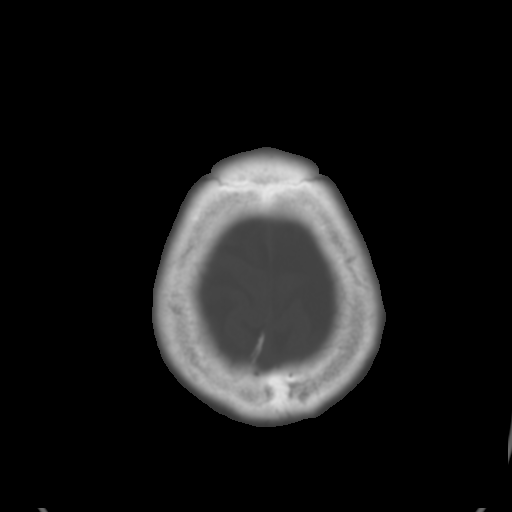
[im 29/32  brain]
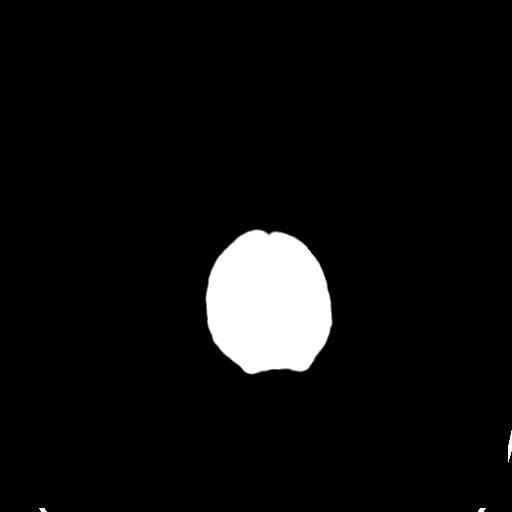

[Series 5: head 3.0 mpr cor · coronal · 0.30mm/px · 3 of 67 slices shown]
[im 23/67  brain]
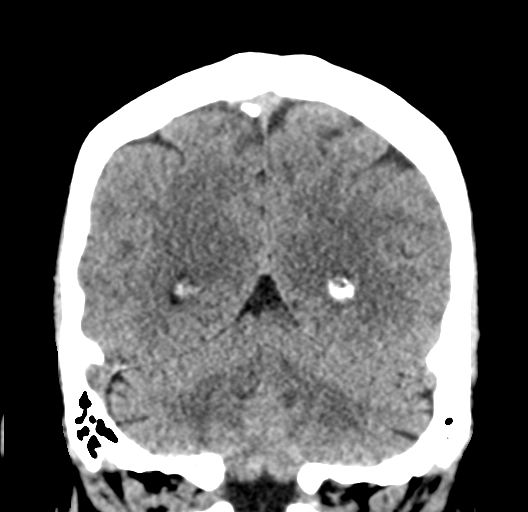
[im 30/67  brain]
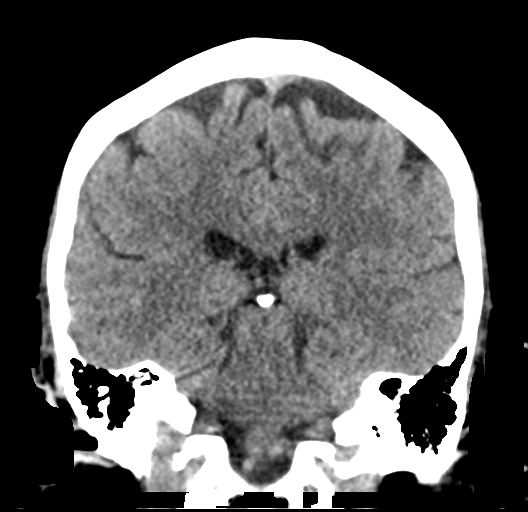
[im 37/67  brain]
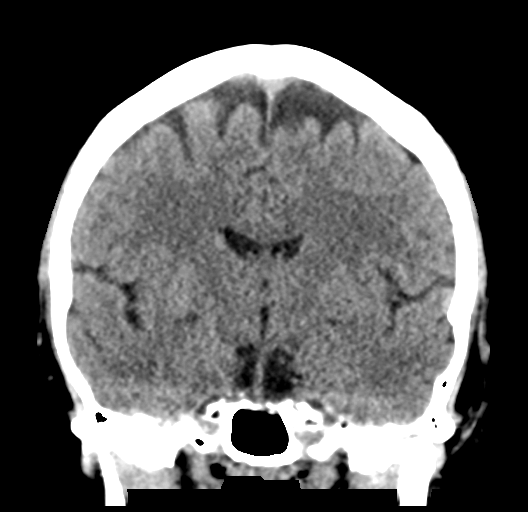

[Series 6: head 3.0 mpr sag · sagittal · 0.30mm/px · 3 of 54 slices shown]
[im 18/54  brain]
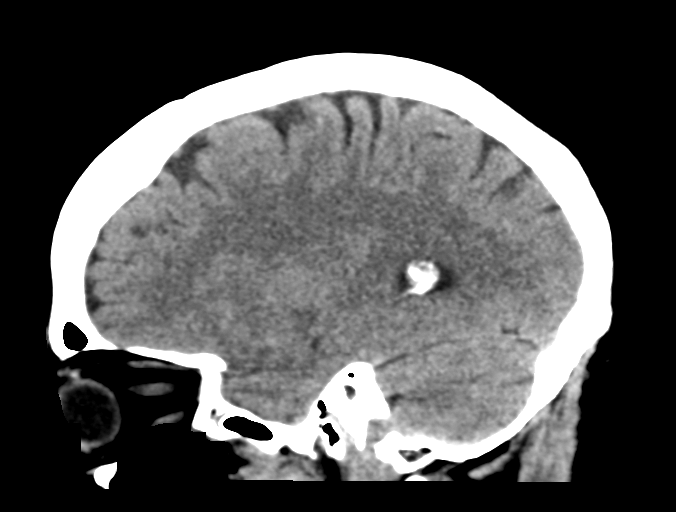
[im 27/54  brain]
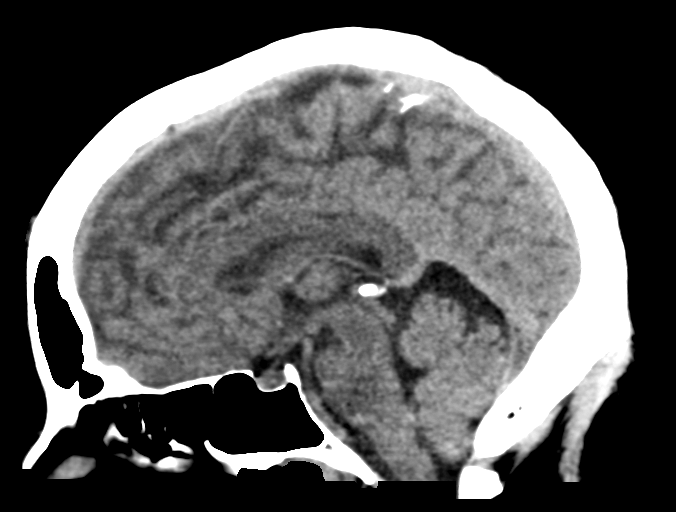
[im 36/54  brain]
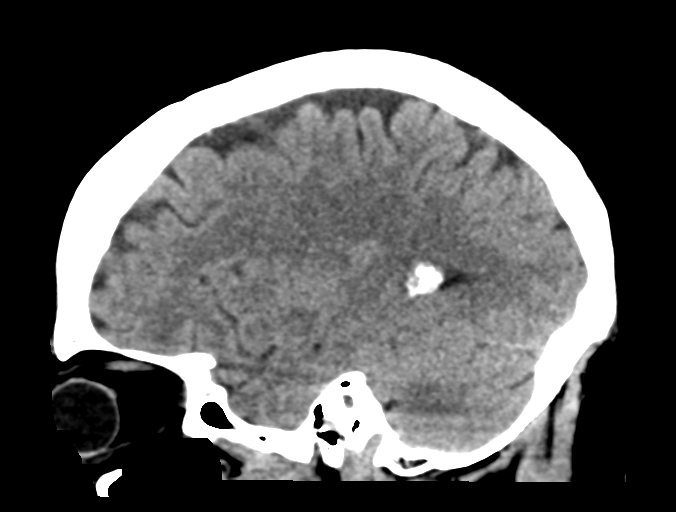

[16 of 47 positions shown; findings below may reference images not displayed]

FINDINGS: Brain: No evidence of acute infarction, hemorrhage, hydrocephalus,
extra-axial collection or mass lesion/mass effect.

Vascular: No hyperdense vessel or unexpected calcification.

Skull: Normal. Negative for fracture or focal lesion.

Sinuses/Orbits: No acute finding.

Other: None.
IMPRESSION: Negative noncontrast head CT.

## 2018-12-14 IMAGING — CT CT ANGIO CHEST
2 of 6 series · 18 of 36 positions shown · IV contrast (Omni 300)
Comparison: Chest radiograph 12/08/2016

CLINICAL DATA: Elevated D-dimer.  Syncope and shortness of breath.

EXAM:
CT ANGIOGRAPHY CHEST WITH CONTRAST
TECHNIQUE: Multidetector CT imaging of the chest was performed using the
standard protocol during bolus administration of intravenous
contrast. Multiplanar CT image reconstructions and MIPs were
obtained to evaluate the vascular anatomy.
CONTRAST:  80 mL Isovue 370 IV

[Series 8: pe thins · axial · 0.72mm/px · z∈[-381,-175]mm · 17 of 232 slices shown]
[im 13/232  lung]
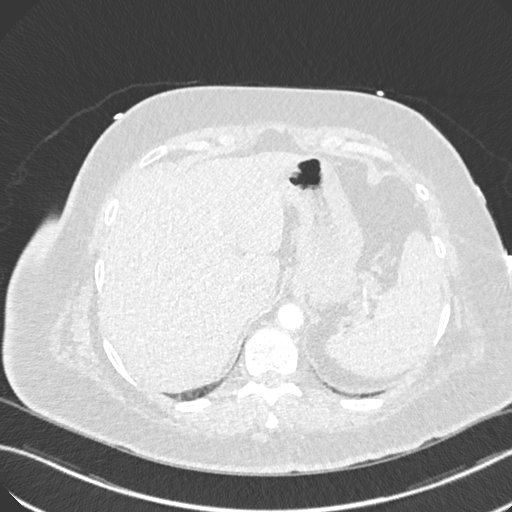
[im 26/232  mediastinal]
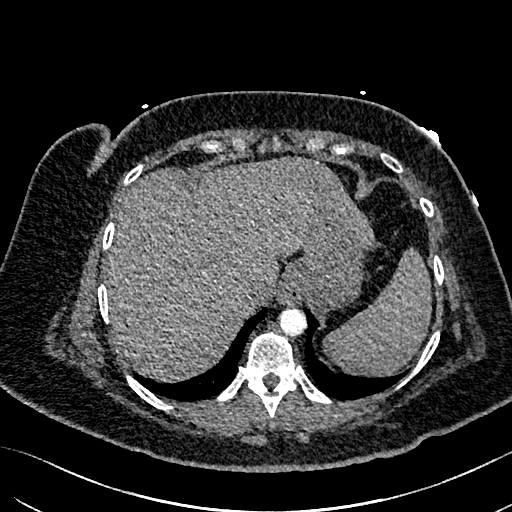
[im 39/232  lung]
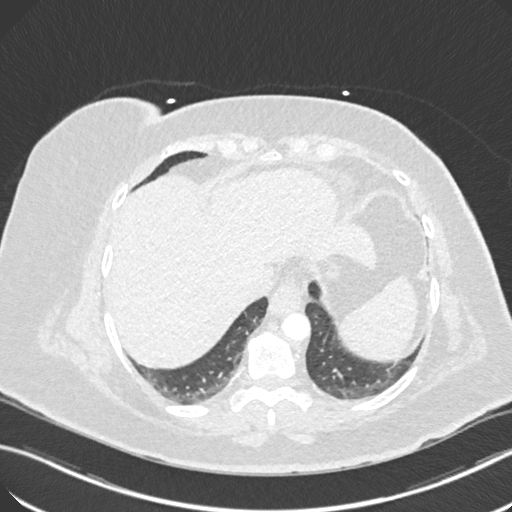
[im 52/232  mediastinal]
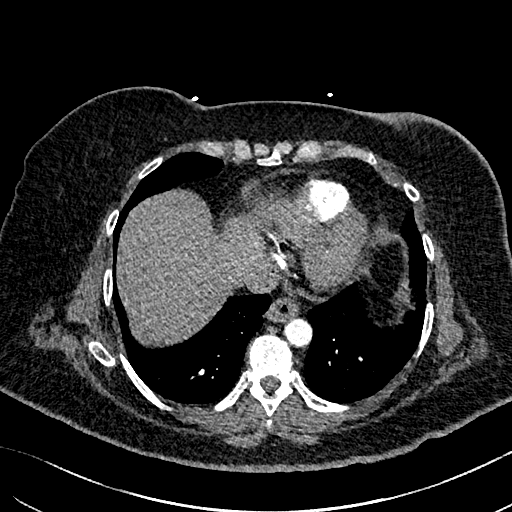
[im 65/232  lung]
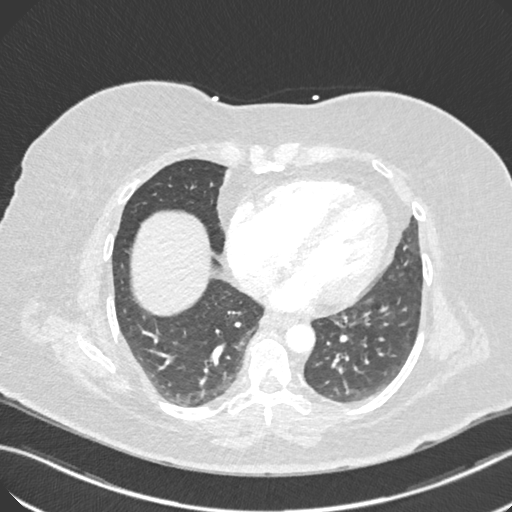
[im 78/232  mediastinal]
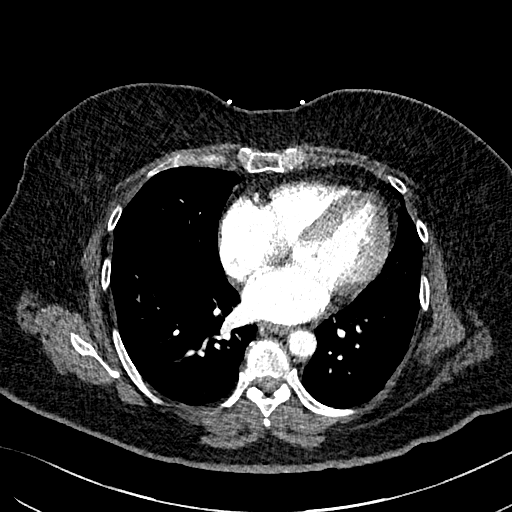
[im 90/232  lung]
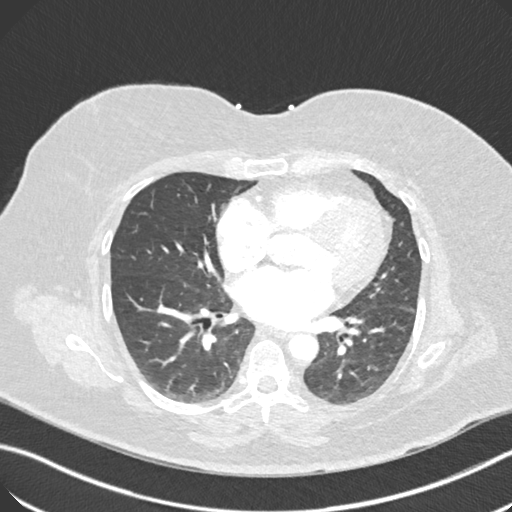
[im 103/232  mediastinal]
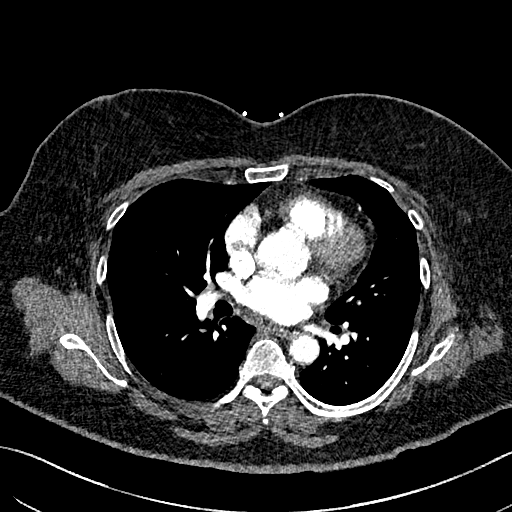
[im 116/232  lung]
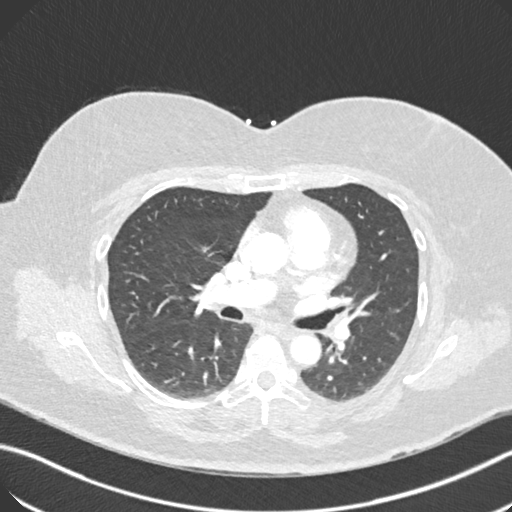
[im 129/232  mediastinal]
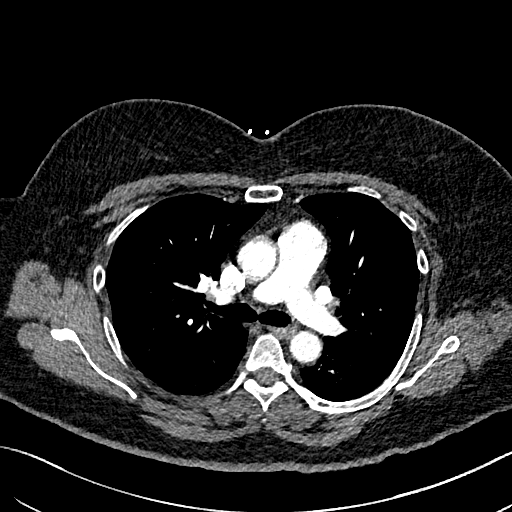
[im 142/232  lung]
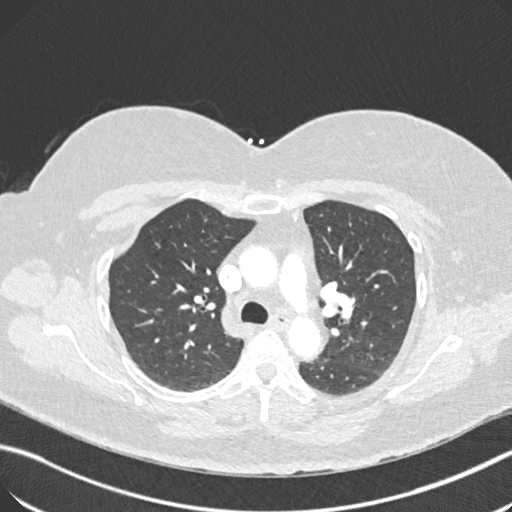
[im 155/232  mediastinal]
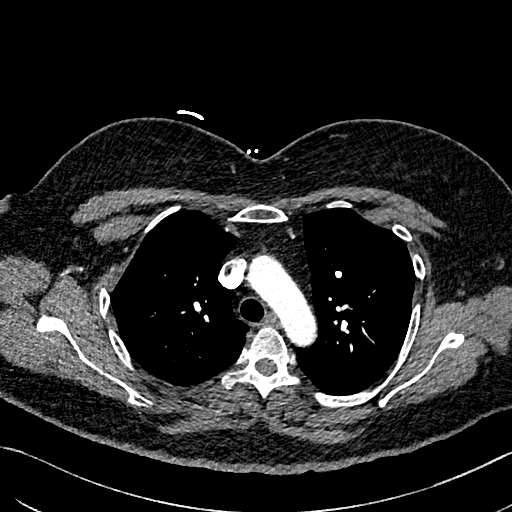
[im 167/232  lung]
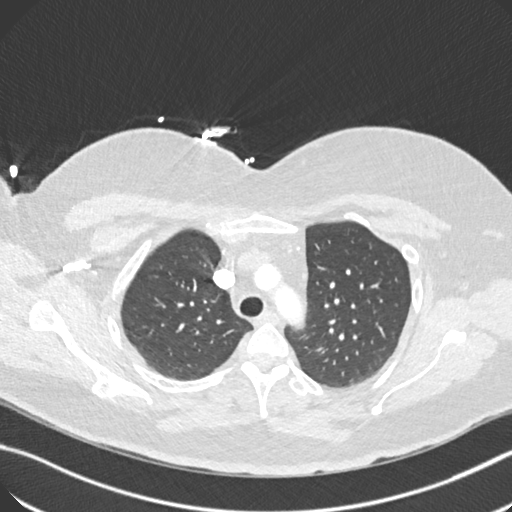
[im 180/232  mediastinal]
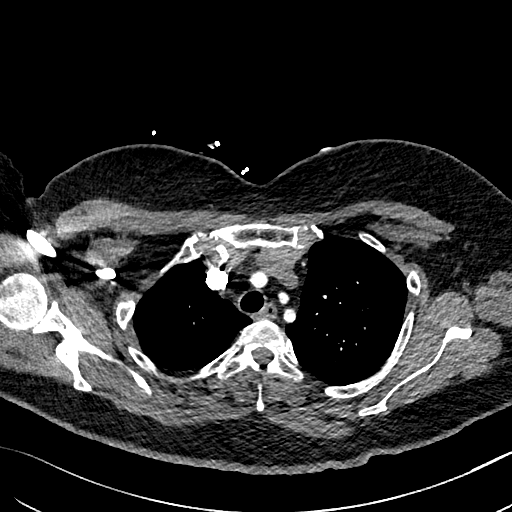
[im 193/232  lung]
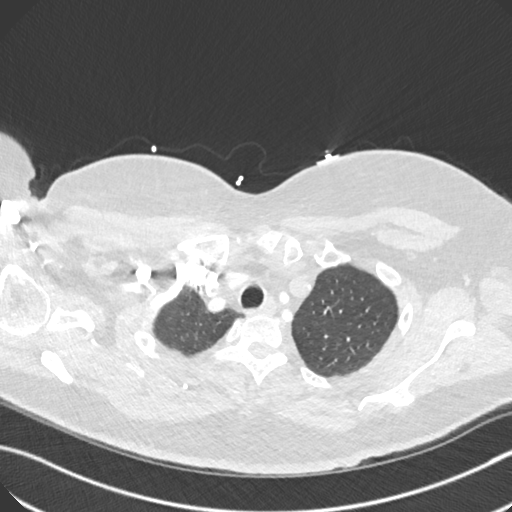
[im 206/232  mediastinal]
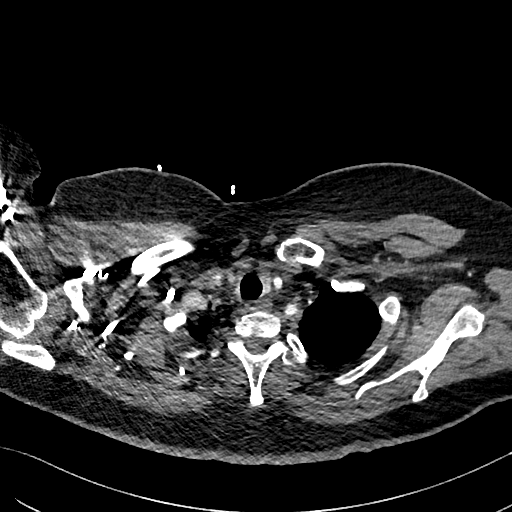
[im 219/232  lung]
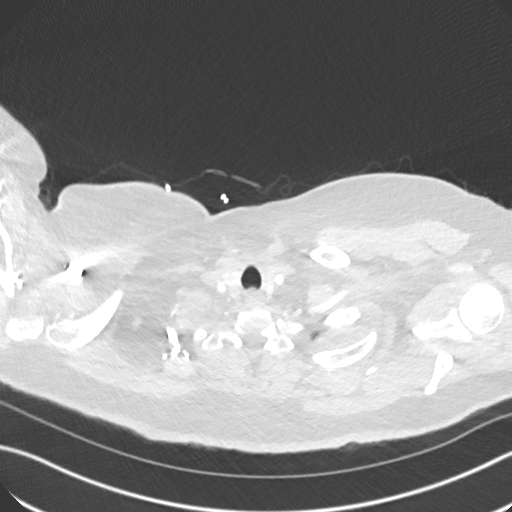

[Series 9: pe 2mm cor · coronal · 0.46mm/px · 1 of 151 slices shown]
[im 76/151  mediastinal]
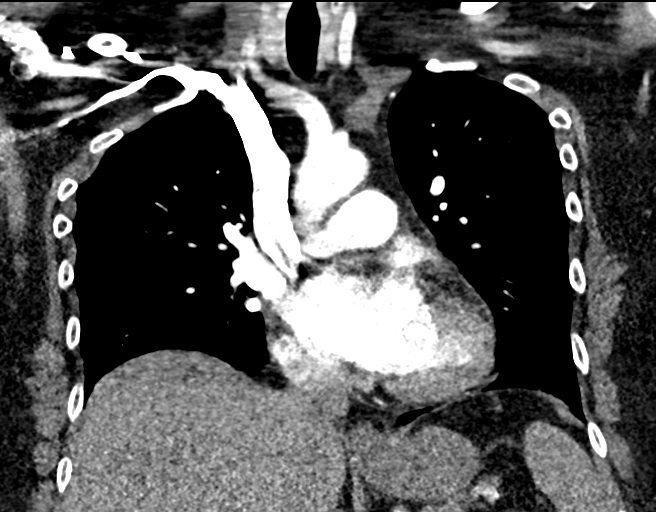

[18 of 36 positions shown; findings below may reference images not displayed]

FINDINGS: Cardiovascular: Contrast injection is sufficient to demonstrate
satisfactory opacification of the pulmonary arteries to the
segmental level. There is no pulmonary embolus. The main pulmonary
artery is within normal limits for size. There is no CT evidence of
acute right heart strain. The visualized aorta is normal. There is a
normal variant aortic arch branching pattern with the
brachiocephalic and left common carotid arteries sharing a common
origin.. Heart size is normal, without pericardial effusion.

Mediastinum/Nodes: No mediastinal, hilar or axillary
lymphadenopathy. The visualized thyroid and thoracic esophageal
course are unremarkable.

Lungs/Pleura: There is a 4 mm perifissural nodule along the right
minor fissure. No pleural effusion or pneumothorax. No focal
airspace consolidation. No focal pleural abnormality. There is
bilateral dependent subsegmental atelectasis.

Upper Abdomen: Contrast bolus timing is not optimized for evaluation
of the abdominal organs. Within this limitation, the visualized
organs of the upper abdomen are normal.

Musculoskeletal: There is a sclerotic focus within the anterior
aspect of the inferior endplate of T8. Bones are otherwise
unremarkable.

Review of the MIP images confirms the above findings.
IMPRESSION: 1. No pulmonary embolus or acute aortic syndrome.
2. Sclerotic focus at the anterior aspect of the T8 inferior
endplate. This may be a benign lesions such as a bone island;
however, if the patient has a history of malignancy, especially
breast cancer, sclerotic metastatic disease would also be a
consideration. No other osseous lesions.

## 2018-12-29 DIAGNOSIS — E113513 Type 2 diabetes mellitus with proliferative diabetic retinopathy with macular edema, bilateral: Secondary | ICD-10-CM | POA: Diagnosis not present
